# Patient Record
Sex: Male | Born: 1950 | Race: Black or African American | Hispanic: No | Marital: Married | Smoking: Former smoker
Health system: Southern US, Community
[De-identification: ages and names within clinical notes are randomized; demographics above are authoritative.]

## PROBLEM LIST (undated history)

## (undated) DIAGNOSIS — M1990 Unspecified osteoarthritis, unspecified site: Secondary | ICD-10-CM

## (undated) DIAGNOSIS — E119 Type 2 diabetes mellitus without complications: Secondary | ICD-10-CM

## (undated) DIAGNOSIS — I10 Essential (primary) hypertension: Secondary | ICD-10-CM

## (undated) DIAGNOSIS — E785 Hyperlipidemia, unspecified: Secondary | ICD-10-CM

## (undated) HISTORY — PX: VASECTOMY: SHX75

## (undated) HISTORY — DX: Type 2 diabetes mellitus without complications: E11.9

## (undated) HISTORY — PX: HERNIA REPAIR: SHX51

## (undated) HISTORY — DX: Hyperlipidemia, unspecified: E78.5

## (undated) HISTORY — PX: OTHER SURGICAL HISTORY: SHX169

## (undated) HISTORY — DX: Unspecified osteoarthritis, unspecified site: M19.90

## (undated) HISTORY — DX: Essential (primary) hypertension: I10

---

## 1999-07-08 ENCOUNTER — Ambulatory Visit: Admission: RE | Admit: 1999-07-08 | Discharge: 1999-07-08 | Payer: Self-pay | Admitting: Family Medicine

## 1999-10-10 ENCOUNTER — Ambulatory Visit: Admission: RE | Admit: 1999-10-10 | Discharge: 1999-10-10 | Payer: Self-pay | Admitting: Family Medicine

## 2000-09-11 ENCOUNTER — Encounter (INDEPENDENT_AMBULATORY_CARE_PROVIDER_SITE_OTHER): Payer: Self-pay | Admitting: *Deleted

## 2000-09-11 ENCOUNTER — Ambulatory Visit (HOSPITAL_BASED_OUTPATIENT_CLINIC_OR_DEPARTMENT_OTHER): Admission: RE | Admit: 2000-09-11 | Discharge: 2000-09-12 | Payer: Self-pay | Admitting: Otolaryngology

## 2003-11-26 ENCOUNTER — Emergency Department (HOSPITAL_COMMUNITY): Admission: EM | Admit: 2003-11-26 | Discharge: 2003-11-26 | Payer: Self-pay | Admitting: Emergency Medicine

## 2008-05-23 ENCOUNTER — Emergency Department (HOSPITAL_BASED_OUTPATIENT_CLINIC_OR_DEPARTMENT_OTHER): Admission: EM | Admit: 2008-05-23 | Discharge: 2008-05-23 | Payer: Self-pay | Admitting: Emergency Medicine

## 2011-01-14 NOTE — Op Note (Signed)
[place]  Patient:    Christian Jordan, Christian Jordan                         MRN: [id number] Proc. Date: 09/11/00 Attending:  Jeannett Senior. Pollyann Kennedy, M.D. CC:         Carola J. Gerri Spore, M.D.   Operative Report  PREOPERATIVE DIAGNOSES: 1. Septal deviation. 2. Turbinate hypertrophy. 3. Tonsillar hypertrophy. 4. Obstructive sleep apnea.  POSTOPERATIVE DIAGNOSES: 1. Septal deviation. 2. Turbinate hypertrophy. 3. Tonsillar hypertrophy. 4. Obstructive sleep apnea. 5. Nasal polyposis.  PROCEDURES: 1. Extensive right nasal polypectomy. 2. Nasal septoplasty. 3. Submucous resection inferior turbinates bilateral. 4. Tonsillectomy. 5. Uvulopalatopharyngoplasty.  SURGEON:  Jefry H. Pollyann Kennedy, M.D.  ANESTHESIA:  General endotracheal anesthesia.  COMPLICATIONS:  None.  ESTIMATED BLOOD LOSS:  40 cc.  Patient tolerated the procedure well, was awakened, extubated and transferred to recovery in stable condition.  REFERRING PHYSICIAN:  Carola J. Gerri Spore, M.D.  FINDINGS: 1. Extensive nasal polyposis on the right side with mild nasal polyposis on    the left side.  Right side, the entire posterior nasal cavity was filled    with large polypoid mass.  The polyps seemed to be arising from the    superior meatus area just behind the vertical attachment of the middle    turbinate. 2. Nasal septal deviation with a large bony spur posteriorly down the left    side causing obstruction of the left nasal cavity. 3. Significant bony enlargement of the inferior turbinates. 4. Moderate enlargement of the tonsils with cryptic spaces and debris. 5. Severely thickened and elongated uvula, soft palate and hypertrophic    muscular posterior fossal arches.  HISTORY:  This is a 60 year old with a history of bad snoring and obstructive sleep apnea.  He was not able to tolerate CPAP.  He has chronic, severe nasal obstruction.  Risks, benefits, alternatives and complications of the  procedure were explained to the patient, who seemed to understand and agreed to surgery.  DESCRIPTION OF PROCEDURE:  The patient was taken to the operating room, placed on the operating table in supine position.  Following induction of general endotracheal anesthesia, the patient was prepped and draped in a standard fashion.  1. Nasal polypectomy.  Nasal cavity was injected with 1% Xylocaine with    1:100,000 epinephrine along the septum, the columella, the inferior    turbinates and the base of the attachment of the polypoid mass on the right    side and left side.  Extensive nasal polypectomy was performed using a    headlight and polypectomy forceps.  Polyp material was sent for pathologic    evaluation.  There was extensive polyposis on the right side with a large    polyp filling the posterior choanae and mild polyposis posteriorly down the    left side with mild obstruction on the left side and secondary to the    polyps.  2. Nasal septoplasty.  A left hemitransfixion incision was used to approach    the septal cartilage and perimucal and perichondrial flap was developed    posteriorly down the left side.  Mucosa was carefully elevated off the    large spur down the left posterior nasal cavity.  The bony cartilaginous    junction was divided and a flap was developed posteriorly down the right    side.  The upper ethmoid plate was severely thinned and was taken down    simply superiorly and posteriorly  with a Therapist, nutritional.  The inferior    attachment was thicker and this was resected using Jansen-Middleton    rongeur.  The large bony spur was resected, which adequately opened up the    posterior nasal cavity on the left side.  The mucosal incision was    reapproximated with 4-0 chromic suture.  The septal flaps were quilted with    4-0 plain gut.  3. Submucous resection inferior turbinates.  The leading edge of the inferior    turbinates was incised vertically with a 15  scalpel.  Freer elevator was    used to elevate the mucosa off the medial, inferior and lateral aspects of    the turbinate bone.  Large fragments of turbinate bone were then resected    with a Takahashi forceps.  The turbinate remnants were out-fractured.  The    mucosa was preserved.  The nasal cavities were packed with rolled up Telfa    gauze coated with bacitracin ointment.  4. Tonsillectomy.  Tonsils were dissected cleanly with their capsule intact    out of their fossa using electrocautery dissection.  There were large    cryptic spaces with debris present on both sides and on the right, inferior    aspect was a small abscess within the substance of the tonsil.  The    tonsillectomy was performed in conjunction with the    uvulopalatopharyngoplasty.  5. Uvulopalatopharyngoplasty.  The upper corner mucosal incisions were marked    with electrocautery in the soft palate.  Palpation assured that there was    sufficient remaining palate to reach the posterior pharyngeal wall and to    prevent postoperative velopharyngeal insufficiency.  The anterior mucosa    was incised with electrocautery and then the deeper tissues were incised as    well.  The muscularis uvulae was incised.  Suction cautery was used in    several small areas where there were vessels present.  The posterior mucosa    was incised leaving slightly excess length, which would be brought forward    later in closure.  The dissection then continued down laterally taking the    severely thickened, hypertrophic posterior fossal arch musculature and some    mucosa as well.  This was kept in continuity with the tonsillectomy    specimen and all were sent together for pathologic evaluation.  Spot    cautery was used as needed for hemostasis.  The pharynx was irrigated with saline and suctioned.  The mucosal closure was then accomplished using interrupted, inverted 4-0 Vicryl suture.  There was nice widening of the pharyngeal  airway.  There was no bleeding or swelling  present at the termination.  The gastric contents were aspirated with an oral gastric tube.  Patient was then awakened from anesthesia, extubated and transferred to recovery room in stable condition. DD:  09/11/00 TD:  09/11/00 Job: [id number]

## 2013-02-25 ENCOUNTER — Ambulatory Visit (INDEPENDENT_AMBULATORY_CARE_PROVIDER_SITE_OTHER): Payer: Federal, State, Local not specified - PPO | Admitting: Family Medicine

## 2013-02-25 ENCOUNTER — Ambulatory Visit: Payer: Federal, State, Local not specified - PPO

## 2013-02-25 VITALS — BP 126/70 | HR 69 | Temp 98.0°F | Resp 18 | Ht 74.5 in | Wt 237.0 lb

## 2013-02-25 DIAGNOSIS — M545 Low back pain, unspecified: Secondary | ICD-10-CM

## 2013-02-25 DIAGNOSIS — M549 Dorsalgia, unspecified: Secondary | ICD-10-CM

## 2013-02-25 DIAGNOSIS — M25511 Pain in right shoulder: Secondary | ICD-10-CM

## 2013-02-25 DIAGNOSIS — M25519 Pain in unspecified shoulder: Secondary | ICD-10-CM

## 2013-02-25 LAB — POCT UA - MICROSCOPIC ONLY
Bacteria, U Microscopic: NEGATIVE
Casts, Ur, LPF, POC: NEGATIVE
Crystals, Ur, HPF, POC: NEGATIVE
Mucus, UA: NEGATIVE
RBC, urine, microscopic: NEGATIVE
WBC, Ur, HPF, POC: NEGATIVE
Yeast, UA: NEGATIVE

## 2013-02-25 LAB — POCT URINALYSIS DIPSTICK
Bilirubin, UA: NEGATIVE
Blood, UA: NEGATIVE
Glucose, UA: 250
Ketones, UA: NEGATIVE
Leukocytes, UA: NEGATIVE
Nitrite, UA: NEGATIVE
Protein, UA: NEGATIVE
Spec Grav, UA: 1.025
Urobilinogen, UA: 2
pH, UA: 7

## 2013-02-25 MED ORDER — PREDNISONE 20 MG PO TABS: ORAL_TABLET | ORAL | Status: DC
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
## 2013-11-27 ENCOUNTER — Ambulatory Visit (INDEPENDENT_AMBULATORY_CARE_PROVIDER_SITE_OTHER): Payer: Federal, State, Local not specified - PPO | Admitting: Family Medicine

## 2013-11-27 VITALS — BP 122/74 | HR 69 | Temp 97.7°F | Resp 18 | Ht 74.5 in | Wt 236.0 lb

## 2013-11-27 DIAGNOSIS — S335XXA Sprain of ligaments of lumbar spine, initial encounter: Secondary | ICD-10-CM

## 2013-11-27 DIAGNOSIS — M545 Low back pain, unspecified: Secondary | ICD-10-CM

## 2013-11-27 NOTE — Patient Instructions (Signed)

## 2013-11-27 NOTE — Progress Notes (Signed)
Subjective:    Patient ID: Christian Jordan, male    DOB: 09-14-1950, 63 y.o.   MRN: [id number]  11/27/2013  Back Pain   Back Pain   This 63 y.o. male presents for evaluation of back pain.    History of chronic back pain.  Onset of lower back pain onset five days ago.  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  Lots of heavy lifting and bending.  About to retire.  Minimal pain now; slightly stiff.  Has FMLA for DDD lumbar spine; job requires excuse.  No radiation into legs; no n/t/w.   Normal b/b function.  No saddle paresthesias.  No medications.  Milder episode this time.  More stiffness than anything else.  Last lumbar films 01/2013 when evaluated by Dr. Milus GlazierLauenstein.  PCP is [facility].  FMLA paperwork is through the [facility].  [facility].  Review of Systems  Musculoskeletal: Positive for back pain.    Past Medical History  Diagnosis Date  . Arthritis   . Diabetes mellitus without complication   . Hyperlipidemia   . Hypertension    No Known Allergies Current Outpatient Prescriptions  Medication Sig Dispense Refill  . acarbose (PRECOSE) 100 MG tablet Take 100 mg by mouth 2 (two) times daily.      Marland Kitchen. amLODipine (NORVASC) 2.5 MG tablet Take 2.5 mg by mouth daily.      Marland Kitchen. atorvastatin (LIPITOR) 40 MG tablet Take 40 mg by mouth daily.      Marland Kitchen. glipiZIDE (GLUCOTROL) 10 MG tablet Take 10 mg by mouth 2 (two) times daily before a meal.      . ibuprofen (ADVIL,MOTRIN) 200 MG tablet Take 200 mg by mouth every 6 (six) hours as needed for pain.      Marland Kitchen. lisinopril (PRINIVIL,ZESTRIL) 40 MG tablet Take 40 mg by mouth daily.      . metFORMIN (GLUCOPHAGE) 1000 MG tablet Take 1,000 mg by mouth 2 (two) times daily with a meal.      . predniSONE (DELTASONE) 20 MG tablet 2 daily with food  10 tablet  1   No current facility-administered medications for this visit.   History   Social History  . Marital Status: Married    Spouse Name: N/A    Number of Children: N/A  . Years of Education: N/A    Occupational History  .      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   Social History Main Topics  . Smoking status: Never Smoker   . Smokeless tobacco: Not on file  . Alcohol Use: No  . Drug Use: No  . Sexual Activity: Not on file   Other Topics Concern  . Not on file   Social History Narrative  . No narrative on file       Objective:    BP 122/74  Pulse 69  Temp(Src) 97.7 F (36.5 C) (Oral)  Resp 18  Ht 6' 2.5" (1.892 m)  Wt 236 lb (107.049 kg)  BMI 29.90 kg/m2  SpO2 96% Physical Exam  Constitutional: He appears well-developed and well-nourished. No distress.  Cardiovascular: Normal rate, regular rhythm and normal heart sounds.   Pulmonary/Chest: Effort normal and breath sounds normal. He has no wheezes. He has no rales.  Musculoskeletal:       Lumbar back: Normal. He exhibits normal range of motion, no tenderness, no bony tenderness, no pain and no spasm.  Lumbar spine:  Full ROM; straight leg raises negative; toe and heel walking intact;  marching intact; motor 5/5 BLE.  Neurological: He has normal strength. No sensory deficit. He exhibits normal muscle tone.  Skin: He is not diaphoretic.   Results for orders placed in visit on 02/25/13  POCT UA - MICROSCOPIC ONLY      Result Value Ref Range   WBC, Ur, HPF, POC neg     RBC, urine, microscopic neg     Bacteria, U Microscopic neg     Mucus, UA neg     Epithelial cells, urine per micros 0-1     Crystals, Ur, HPF, POC neg     Casts, Ur, LPF, POC neg     Yeast, UA neg    POCT URINALYSIS DIPSTICK      Result Value Ref Range   Color, UA yellow     Clarity, UA clear     Glucose, UA 250     Bilirubin, UA neg     Ketones, UA neg     Spec Grav, UA 1.025     Blood, UA neg     pH, UA 7.0     Protein, UA neg     Urobilinogen, UA 2.0     Nitrite, UA neg     Leukocytes, UA Negative         Assessment & Plan:  Low back pain  Lumbar sprain  1. Low back pain/lumbar sprain: recurrent and now resolved; oow note x 3 days;  return to work tomorrow; recommend regular exercise, weight loss, home exercise program to perform daily. No orders of the defined types were placed in this encounter.    No Follow-up on file.   Nilda Simmer, M.D.  [facility] [address] [place], [place]  [address] [phone] phone [phone] fax

## 2015-11-26 ENCOUNTER — Emergency Department (HOSPITAL_COMMUNITY)
Admission: EM | Admit: 2015-11-26 | Discharge: 2015-11-26 | Disposition: A | Discharge status: Home / Self Care | Payer: No Typology Code available for payment source | Attending: Emergency Medicine | Admitting: Emergency Medicine

## 2015-11-26 ENCOUNTER — Encounter (HOSPITAL_COMMUNITY): Payer: Self-pay | Admitting: Emergency Medicine

## 2015-11-26 DIAGNOSIS — Z79899 Other long term (current) drug therapy: Secondary | ICD-10-CM | POA: Diagnosis not present

## 2015-11-26 DIAGNOSIS — E119 Type 2 diabetes mellitus without complications: Secondary | ICD-10-CM | POA: Insufficient documentation

## 2015-11-26 DIAGNOSIS — Y998 Other external cause status: Secondary | ICD-10-CM | POA: Insufficient documentation

## 2015-11-26 DIAGNOSIS — Z7984 Long term (current) use of oral hypoglycemic drugs: Secondary | ICD-10-CM | POA: Insufficient documentation

## 2015-11-26 DIAGNOSIS — Y9389 Activity, other specified: Secondary | ICD-10-CM | POA: Diagnosis not present

## 2015-11-26 DIAGNOSIS — M1990 Unspecified osteoarthritis, unspecified site: Secondary | ICD-10-CM | POA: Insufficient documentation

## 2015-11-26 DIAGNOSIS — E785 Hyperlipidemia, unspecified: Secondary | ICD-10-CM | POA: Diagnosis not present

## 2015-11-26 DIAGNOSIS — S199XXA Unspecified injury of neck, initial encounter: Secondary | ICD-10-CM | POA: Insufficient documentation

## 2015-11-26 DIAGNOSIS — Z7952 Long term (current) use of systemic steroids: Secondary | ICD-10-CM | POA: Diagnosis not present

## 2015-11-26 DIAGNOSIS — S4992XA Unspecified injury of left shoulder and upper arm, initial encounter: Secondary | ICD-10-CM | POA: Diagnosis not present

## 2015-11-26 DIAGNOSIS — Y92410 Unspecified street and highway as the place of occurrence of the external cause: Secondary | ICD-10-CM | POA: Diagnosis not present

## 2015-11-26 DIAGNOSIS — I10 Essential (primary) hypertension: Secondary | ICD-10-CM | POA: Insufficient documentation

## 2015-11-26 DIAGNOSIS — S4991XA Unspecified injury of right shoulder and upper arm, initial encounter: Secondary | ICD-10-CM | POA: Insufficient documentation

## 2015-11-26 MED ORDER — NAPROXEN SODIUM 220 MG PO TABS: 220.0000 mg | ORAL_TABLET | Freq: Two times a day (BID) | ORAL | Status: DC
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
## 2018-04-19 ENCOUNTER — Other Ambulatory Visit: Payer: Self-pay

## 2018-04-19 ENCOUNTER — Encounter (HOSPITAL_COMMUNITY): Payer: Self-pay | Admitting: *Deleted

## 2018-04-19 ENCOUNTER — Emergency Department (HOSPITAL_COMMUNITY)
Admission: EM | Admit: 2018-04-19 | Discharge: 2018-04-20 | Disposition: A | Discharge status: Home / Self Care | Payer: No Typology Code available for payment source | Attending: Emergency Medicine | Admitting: Emergency Medicine

## 2018-04-19 DIAGNOSIS — I10 Essential (primary) hypertension: Secondary | ICD-10-CM | POA: Insufficient documentation

## 2018-04-19 DIAGNOSIS — R109 Unspecified abdominal pain: Secondary | ICD-10-CM

## 2018-04-19 DIAGNOSIS — Z794 Long term (current) use of insulin: Secondary | ICD-10-CM | POA: Diagnosis not present

## 2018-04-19 DIAGNOSIS — Z7982 Long term (current) use of aspirin: Secondary | ICD-10-CM | POA: Diagnosis not present

## 2018-04-19 DIAGNOSIS — Z79899 Other long term (current) drug therapy: Secondary | ICD-10-CM | POA: Diagnosis not present

## 2018-04-19 DIAGNOSIS — K8010 Calculus of gallbladder with chronic cholecystitis without obstruction: Secondary | ICD-10-CM | POA: Diagnosis not present

## 2018-04-19 DIAGNOSIS — K8020 Calculus of gallbladder without cholecystitis without obstruction: Secondary | ICD-10-CM | POA: Diagnosis not present

## 2018-04-19 DIAGNOSIS — E119 Type 2 diabetes mellitus without complications: Secondary | ICD-10-CM | POA: Insufficient documentation

## 2018-04-19 LAB — COMPREHENSIVE METABOLIC PANEL
ALBUMIN: 3.9 g/dL (ref 3.5–5.0)
ALK PHOS: 67 U/L (ref 38–126)
ALT: 24 U/L (ref 0–44)
AST: 19 U/L (ref 15–41)
Anion gap: 8 (ref 5–15)
BILIRUBIN TOTAL: 0.5 mg/dL (ref 0.3–1.2)
BUN: 13 mg/dL (ref 8–23)
CALCIUM: 9.1 mg/dL (ref 8.9–10.3)
CO2: 30 mmol/L (ref 22–32)
Chloride: 104 mmol/L (ref 98–111)
Creatinine, Ser: 1.42 mg/dL — ABNORMAL HIGH (ref 0.61–1.24)
GFR calc Af Amer: 57 mL/min — ABNORMAL LOW (ref >60)
GFR calc non Af Amer: 49 mL/min — ABNORMAL LOW (ref >60)
GLUCOSE: 89 mg/dL (ref 70–99)
Potassium: 4.5 mmol/L (ref 3.5–5.1)
SODIUM: 142 mmol/L (ref 135–145)
Total Protein: 7.2 g/dL (ref 6.5–8.1)

## 2018-04-19 LAB — CBC
HEMATOCRIT: 39.1 % (ref 39.0–52.0)
Hemoglobin: 13 g/dL (ref 13.0–17.0)
MCH: 30.4 pg (ref 26.0–34.0)
MCHC: 33.2 g/dL (ref 30.0–36.0)
MCV: 91.4 fL (ref 78.0–100.0)
Platelets: 238 10*3/uL (ref 150–400)
RBC: 4.28 MIL/uL (ref 4.22–5.81)
RDW: 12.5 % (ref 11.5–15.5)
WBC: 8.4 10*3/uL (ref 4.0–10.5)

## 2018-04-19 LAB — URINALYSIS, ROUTINE W REFLEX MICROSCOPIC
BACTERIA UA: NONE SEEN
BILIRUBIN URINE: NEGATIVE
Glucose, UA: NEGATIVE mg/dL
Hgb urine dipstick: NEGATIVE
Ketones, ur: NEGATIVE mg/dL
Nitrite: NEGATIVE
PH: 7 (ref 5.0–8.0)
Protein, ur: NEGATIVE mg/dL
SPECIFIC GRAVITY, URINE: 1.021 (ref 1.005–1.030)

## 2018-04-19 LAB — LIPASE, BLOOD: Lipase: 70 U/L — ABNORMAL HIGH (ref 11–51)

## 2018-04-19 MED ORDER — ONDANSETRON 4 MG PO TBDP
4.0000 mg | ORAL_TABLET | Freq: Once | ORAL | Status: AC | PRN
  Administered 2018-04-19: 4 mg via ORAL
  Filled 2018-04-19: qty 1

## 2018-04-19 NOTE — ED Triage Notes (Signed)
Pt c/o R sided abd pain with some pain in his back. Pain had sudden onset around noon today. Had a CT last week because he has had kidney stones in the past but the CT was negative for stones at that time.

## 2018-04-19 NOTE — ED Notes (Signed)
Pt reporting incr'd nausea.

## 2018-04-20 ENCOUNTER — Emergency Department (HOSPITAL_COMMUNITY): Payer: No Typology Code available for payment source

## 2018-04-20 ENCOUNTER — Encounter (HOSPITAL_COMMUNITY): Payer: Self-pay

## 2018-04-20 ENCOUNTER — Other Ambulatory Visit: Payer: Self-pay

## 2018-04-20 ENCOUNTER — Inpatient Hospital Stay (HOSPITAL_COMMUNITY)
Admission: EM | Admit: 2018-04-20 | Discharge: 2018-04-22 | DRG: 419 | Disposition: A | Discharge status: Home / Self Care | Payer: Medicare Other | Attending: General Surgery | Admitting: General Surgery

## 2018-04-20 DIAGNOSIS — K819 Cholecystitis, unspecified: Secondary | ICD-10-CM | POA: Diagnosis present

## 2018-04-20 DIAGNOSIS — I10 Essential (primary) hypertension: Secondary | ICD-10-CM | POA: Diagnosis present

## 2018-04-20 DIAGNOSIS — Z7982 Long term (current) use of aspirin: Secondary | ICD-10-CM

## 2018-04-20 DIAGNOSIS — Z791 Long term (current) use of non-steroidal anti-inflammatories (NSAID): Secondary | ICD-10-CM

## 2018-04-20 DIAGNOSIS — K429 Umbilical hernia without obstruction or gangrene: Secondary | ICD-10-CM | POA: Diagnosis present

## 2018-04-20 DIAGNOSIS — R7989 Other specified abnormal findings of blood chemistry: Secondary | ICD-10-CM | POA: Diagnosis present

## 2018-04-20 DIAGNOSIS — M1990 Unspecified osteoarthritis, unspecified site: Secondary | ICD-10-CM | POA: Diagnosis present

## 2018-04-20 DIAGNOSIS — E785 Hyperlipidemia, unspecified: Secondary | ICD-10-CM | POA: Diagnosis present

## 2018-04-20 DIAGNOSIS — Z794 Long term (current) use of insulin: Secondary | ICD-10-CM

## 2018-04-20 DIAGNOSIS — E1140 Type 2 diabetes mellitus with diabetic neuropathy, unspecified: Secondary | ICD-10-CM | POA: Diagnosis present

## 2018-04-20 DIAGNOSIS — N289 Disorder of kidney and ureter, unspecified: Secondary | ICD-10-CM | POA: Diagnosis present

## 2018-04-20 DIAGNOSIS — K8020 Calculus of gallbladder without cholecystitis without obstruction: Secondary | ICD-10-CM

## 2018-04-20 DIAGNOSIS — E669 Obesity, unspecified: Secondary | ICD-10-CM | POA: Diagnosis present

## 2018-04-20 DIAGNOSIS — K8010 Calculus of gallbladder with chronic cholecystitis without obstruction: Principal | ICD-10-CM | POA: Diagnosis present

## 2018-04-20 LAB — COMPREHENSIVE METABOLIC PANEL
ALK PHOS: 78 U/L (ref 38–126)
ALT: 20 U/L (ref 0–44)
ANION GAP: 12 (ref 5–15)
AST: 16 U/L (ref 15–41)
Albumin: 4 g/dL (ref 3.5–5.0)
BUN: 13 mg/dL (ref 8–23)
CALCIUM: 9 mg/dL (ref 8.9–10.3)
CO2: 25 mmol/L (ref 22–32)
CREATININE: 1.66 mg/dL — AB (ref 0.61–1.24)
Chloride: 98 mmol/L (ref 98–111)
GFR, EST AFRICAN AMERICAN: 47 mL/min — AB (ref >60)
GFR, EST NON AFRICAN AMERICAN: 41 mL/min — AB (ref >60)
Glucose, Bld: 141 mg/dL — ABNORMAL HIGH (ref 70–99)
Potassium: 4.2 mmol/L (ref 3.5–5.1)
Sodium: 135 mmol/L (ref 135–145)
TOTAL PROTEIN: 7.2 g/dL (ref 6.5–8.1)
Total Bilirubin: 0.8 mg/dL (ref 0.3–1.2)

## 2018-04-20 LAB — CBC
HCT: 39.5 % (ref 39.0–52.0)
HEMOGLOBIN: 13.2 g/dL (ref 13.0–17.0)
MCH: 30.6 pg (ref 26.0–34.0)
MCHC: 33.4 g/dL (ref 30.0–36.0)
MCV: 91.6 fL (ref 78.0–100.0)
PLATELETS: 236 10*3/uL (ref 150–400)
RBC: 4.31 MIL/uL (ref 4.22–5.81)
RDW: 12.4 % (ref 11.5–15.5)
WBC: 9.4 10*3/uL (ref 4.0–10.5)

## 2018-04-20 LAB — LIPASE, BLOOD: LIPASE: 24 U/L (ref 11–51)

## 2018-04-20 MED ORDER — HYDROMORPHONE HCL 1 MG/ML IJ SOLN
1.0000 mg | Freq: Once | INTRAMUSCULAR | Status: AC
  Administered 2018-04-20: 1 mg via INTRAVENOUS
  Filled 2018-04-20: qty 1

## 2018-04-20 MED ORDER — ONDANSETRON 8 MG PO TBDP: 8.0000 mg | ORAL_TABLET | Freq: Three times a day (TID) | ORAL | 0 refills | Status: DC | PRN

## 2018-04-20 MED ORDER — SODIUM CHLORIDE 0.9 % IV SOLN
INTRAVENOUS | Status: DC
  Administered 2018-04-20 (×2): via INTRAVENOUS

## 2018-04-20 MED ORDER — ACETAMINOPHEN 325 MG PO TABS: 650.0000 mg | ORAL_TABLET | Freq: Four times a day (QID) | ORAL | 0 refills | Status: DC | PRN

## 2018-04-20 MED ORDER — ONDANSETRON HCL 4 MG/2ML IJ SOLN
4.0000 mg | Freq: Once | INTRAMUSCULAR | Status: AC
  Administered 2018-04-20: 4 mg via INTRAVENOUS
  Filled 2018-04-20: qty 2

## 2018-04-20 MED ORDER — MORPHINE SULFATE (PF) 4 MG/ML IV SOLN
4.0000 mg | Freq: Once | INTRAVENOUS | Status: DC
  Filled 2018-04-20: qty 1

## 2018-04-20 MED ORDER — OMEPRAZOLE 20 MG PO CPDR: 20.0000 mg | DELAYED_RELEASE_CAPSULE | Freq: Every day | ORAL | 0 refills | Status: DC

## 2018-04-20 MED ORDER — ONDANSETRON HCL 4 MG/2ML IJ SOLN
4.0000 mg | Freq: Once | INTRAMUSCULAR | Status: DC
  Filled 2018-04-20: qty 2

## 2018-04-20 NOTE — ED Provider Notes (Signed)
[facility] EMERGENCY DEPARTMENT Provider Note   CSN: [id number] Arrival date & time: 04/19/18  2209     History   Chief Complaint Chief Complaint  Patient presents with  . Abdominal Pain    HPI Christian Jordan is a 67 y.o. male.  HPI  66 year old male comes in with chief complaint of abdominal pain.  Patient has history of diabetes, hyperlipidemia and hypertension.  He reports that around noon he started having sudden onset right upper quadrant abdominal pain.  Pain is radiating to his back.  Patient's pain is described as sharp pain.  He has nausea without vomiting.  Patient denies any UTI-like symptoms.  Patient has history of kidney stones, however reports that he just had a CAT scan last week which did not show any stones.  Patient has had normal bowel movements today.  He denies any fevers, chills.   Patient denies any heavy alcohol use, liver disease, history of kidney infections, heavy smoking or active drug use  Past Medical History:  Diagnosis Date  . Arthritis   . Diabetes mellitus without complication (HCC)   . Hyperlipidemia   . Hypertension     There are no active problems to display for this patient.   Past Surgical History:  Procedure Laterality Date  . HERNIA REPAIR    . Rotator cuff surgery l    . VASECTOMY          Home Medications    Prior to Admission medications   Medication Sig Start Date End Date Taking? Authorizing Provider  acarbose (PRECOSE) 100 MG tablet Take 100 mg by mouth 2 (two) times daily.   Yes [provider]  amLODipine (NORVASC) 2.5 MG tablet Take 2.5 mg by mouth daily.   Yes [provider]  aspirin EC 81 MG tablet Take 81 mg by mouth daily.   Yes [provider]  atorvastatin (LIPITOR) 40 MG tablet Take 40 mg by mouth daily.   Yes [provider]  gabapentin (NEURONTIN) 300 MG capsule Take 300 mg by mouth 2 (two) times daily.   Yes [provider]  insulin aspart  protamine- aspart (NOVOLOG MIX 70/30) (70-30) 100 UNIT/ML injection Inject 30 Units into the skin 2 (two) times daily with a meal.   Yes [provider]  lisinopril (PRINIVIL,ZESTRIL) 40 MG tablet Take 40 mg by mouth daily.   Yes [provider]  metFORMIN (GLUCOPHAGE) 1000 MG tablet Take 1,000 mg by mouth 2 (two) times daily with a meal.   Yes [provider]  naproxen (NAPROSYN) 500 MG tablet Take 500 mg by mouth 2 (two) times daily with a meal.   Yes [provider]  acetaminophen (TYLENOL) 325 MG tablet Take 2 tablets (650 mg total) by mouth every 6 (six) hours as needed. 04/20/18   [name], MD  omeprazole (PRILOSEC) 20 MG capsule Take 1 capsule (20 mg total) by mouth daily. 04/20/18   [name], MD  ondansetron (ZOFRAN ODT) 8 MG disintegrating tablet Take 1 tablet (8 mg total) by mouth every 8 (eight) hours as needed for nausea. 04/20/18   [name], MD    Family History No family history on file.  Social History Social History   Tobacco Use  . Smoking status: Never Smoker  Substance Use Topics  . Alcohol use: No  . Drug use: No     Allergies   Patient has no known allergies.   Review of Systems Review of Systems  Constitutional: Positive  for activity change.  Gastrointestinal: Positive for anal bleeding and nausea.  Genitourinary: Negative for dysuria.  Allergic/Immunologic: Negative for immunocompromised state.  Hematological: Does not bruise/bleed easily.  All other systems reviewed and are negative.    Physical Exam Updated Vital Signs BP (!) 146/73 (BP Location: Right Arm)   Pulse 65   Temp 98 F (36.7 C) (Oral)   Resp 14   SpO2 98%   Physical Exam  Constitutional: He is oriented to person, place, and time. He appears well-developed.  HENT:  Head: Atraumatic.  Neck: Neck supple.  Cardiovascular: Normal rate.  Pulmonary/Chest: Effort normal.  Abdominal: There is tenderness in the right upper quadrant.  There is no guarding, no tenderness at McBurney's point and negative Murphy's sign.  Neurological: He is alert and oriented to person, place, and time.  Skin: Skin is warm.  Nursing note and vitals reviewed.    ED Treatments / Results  Labs (all labs ordered are listed, but only abnormal results are displayed) Labs Reviewed  LIPASE, BLOOD - Abnormal; Notable for the following components:      Result Value   Lipase 70 (*)    All other components within normal limits  COMPREHENSIVE METABOLIC PANEL - Abnormal; Notable for the following components:   Creatinine, Ser 1.42 (*)    GFR calc non Af Amer 49 (*)    GFR calc Af Amer 57 (*)    All other components within normal limits  URINALYSIS, ROUTINE W REFLEX MICROSCOPIC - Abnormal; Notable for the following components:   Leukocytes, UA TRACE (*)    All other components within normal limits  CBC    EKG None  Radiology [place] Abdomen Limited Ruq  Result Date: 04/20/2018 CLINICAL DATA:  Abdominal pain today. EXAM: ULTRASOUND ABDOMEN LIMITED RIGHT UPPER QUADRANT COMPARISON:  None. FINDINGS: Gallbladder: Physiologically distended containing multiple mobile gallstones, largest 7 mm. Small 2 mm polyp versus adherent stone. No pericholecystic fluid or gallbladder wall thickening. No sonographic Murphy sign noted by sonographer. Common bile duct: Diameter: 4 mm. Liver: Subcentimeter cyst in the right hepatic lobe. No suspicious lesion. Within normal limits in parenchymal echogenicity. Portal vein is patent on color Doppler imaging with normal direction of blood flow towards the liver. IMPRESSION: 1. Gallstones and tiny gallbladder polyp. No sonographic findings of cholecystitis. No biliary dilatation. 2. Incidental subcentimeter hepatic cyst. Electronically Signed   By: Rubye OaksMelanie  Ehinger M.D.   On: 04/20/2018 01:19    Procedures Procedures (including critical care time)  Medications Ordered in ED Medications  morphine 4 MG/ML injection 4 mg (4 mg  Intravenous Not Given 04/20/18 0134)  ondansetron (ZOFRAN) injection 4 mg (4 mg Intravenous Not Given 04/20/18 0135)  ondansetron (ZOFRAN-ODT) disintegrating tablet 4 mg (4 mg Oral Given 04/19/18 2308)     Initial Impression / Assessment and Plan / ED Course  I have reviewed the triage vital signs and the nursing notes.  Pertinent labs & imaging results that were available during my care of the patient were reviewed by me and considered in my medical decision making (see chart for details).  Clinical Course as of Apr 21 447  Fri Apr 20, 2018  0227 Results of the ultrasound discussed with the patient.  Patient reports that his pain has improved after morphine, pain currently at 4 or 5 out of 10, and it is quite tolerable.  Patient was given the option of conservative approach, which would require him to follow-up with outpatient doctors for possible HIDA scan or other diagnostic  testing.  He was also given the aggressive option of staying in the hospital as an observation status, and get HIDA scan.  Patient has chosen the former option, stating that he might be able to get into the [place] system quickly. Appropriate return precautions have been discussed with the patient and he is comfortable with the plan of outpatient follow-up with strict ER return precautions.   [AN]    Clinical Course User Index [AN] [name], MD    67 year old male comes in with chief complaint of right upper quadrant abdominal pain that started after food intake.  Patient's pain is radiating to the back.  On exam he has right-sided abdominal tenderness, with negative Murphy sign.  No UTI-like symptoms, therefore we do not think patient has pyelonephritis.  Ultrasound right upper quadrant ordered. Lipase is slightly elevated, patient does not have any clinical findings of pancreatitis.  There could be an element of gastritis as the underlying cause, however as mentioned earlier, the pain is not epigastric in  nature.  Final Clinical Impressions(s) / ED Diagnoses   Final diagnoses:  Abdominal pain  Calculus of gallbladder without cholecystitis without obstruction    ED Discharge Orders         Ordered    ondansetron (ZOFRAN ODT) 8 MG disintegrating tablet  Every 8 hours PRN     04/20/18 0231    acetaminophen (TYLENOL) 325 MG tablet  Every 6 hours PRN     04/20/18 0231    omeprazole (PRILOSEC) 20 MG capsule  Daily     04/20/18 0231           [name], MD 04/20/18 0448

## 2018-04-20 NOTE — ED Notes (Signed)
No signature pad available. Discharge instructions reviewed and all questions answered. Pt verbalized understanding.

## 2018-04-20 NOTE — ED Provider Notes (Signed)
[facility] EMERGENCY DEPARTMENT Provider Note   CSN: [id number] Arrival date & time: 04/20/18  1610     History   Chief Complaint No chief complaint on file.   HPI Christian Jordan is a 67 y.o. male.  Patient with onset of right upper quadrant abdominal pain yesterday afternoon at about 1 PM.  Patient was here evaluation overnight went home around 7 in the morning.  Ultrasound showed multiple gallstones with normal common bile duct.  His labs were normal other than a slightly elevated lipase at 70.  Patient still had pain 4 out of 10 when he was discharged but it was improved.  Patient was given the option for general surgery to see him but he opted to go home and is going to outpatient follow-up with general surgery.  Returns again tonight's pain never really went away got worse again this evening.  Did have an episode of nausea and vomiting.       Past Medical History:  Diagnosis Date  . Arthritis   . Diabetes mellitus without complication (HCC)   . Hyperlipidemia   . Hypertension     There are no active problems to display for this patient.   Past Surgical History:  Procedure Laterality Date  . HERNIA REPAIR    . Rotator cuff surgery l    . VASECTOMY          Home Medications    Prior to Admission medications   Medication Sig Start Date End Date Taking? Authorizing Provider  acarbose (PRECOSE) 100 MG tablet Take 100 mg by mouth 2 (two) times daily.    [provider]  acetaminophen (TYLENOL) 325 MG tablet Take 2 tablets (650 mg total) by mouth every 6 (six) hours as needed. 04/20/18   [name], MD  amLODipine (NORVASC) 2.5 MG tablet Take 2.5 mg by mouth daily.    [provider]  aspirin EC 81 MG tablet Take 81 mg by mouth daily.    [provider]  atorvastatin (LIPITOR) 40 MG tablet Take 40 mg by mouth daily.    [provider]  gabapentin (NEURONTIN) 300 MG capsule Take 300 mg by mouth 2 (two) times  daily.    [provider]  insulin aspart protamine- aspart (NOVOLOG MIX 70/30) (70-30) 100 UNIT/ML injection Inject 30 Units into the skin 2 (two) times daily with a meal.    [provider]  lisinopril (PRINIVIL,ZESTRIL) 40 MG tablet Take 40 mg by mouth daily.    [provider]  metFORMIN (GLUCOPHAGE) 1000 MG tablet Take 1,000 mg by mouth 2 (two) times daily with a meal.    [provider]  naproxen (NAPROSYN) 500 MG tablet Take 500 mg by mouth 2 (two) times daily with a meal.    [provider]  omeprazole (PRILOSEC) 20 MG capsule Take 1 capsule (20 mg total) by mouth daily. 04/20/18   [name], MD  ondansetron (ZOFRAN ODT) 8 MG disintegrating tablet Take 1 tablet (8 mg total) by mouth every 8 (eight) hours as needed for nausea. 04/20/18   [name], MD    Family History No family history on file.  Social History Social History   Tobacco Use  . Smoking status: Never Smoker  Substance Use Topics  . Alcohol use: No  . Drug use: No     Allergies   Patient has no known allergies.   Review of Systems Review of Systems  Constitutional: Negative for fever.  HENT: Negative  for congestion.   Eyes: Negative for redness.  Respiratory: Negative for shortness of breath.   Cardiovascular: Negative for chest pain.  Gastrointestinal: Positive for abdominal pain, nausea and vomiting.  Genitourinary: Negative for dysuria.  Musculoskeletal: Negative for back pain.  Skin: Negative for rash.  Neurological: Negative for headaches.  Hematological: Does not bruise/bleed easily.  Psychiatric/Behavioral: Negative for confusion.     Physical Exam Updated Vital Signs BP (!) 177/86   Pulse (!) 59   Temp 98.4 F (36.9 C) (Oral)   Resp 20   SpO2 98%   Physical Exam  Constitutional: He is oriented to person, place, and time. He appears well-developed and well-nourished. No distress.  HENT:  Head: Normocephalic and atraumatic.    Mouth/Throat: Oropharynx is clear and moist.  Eyes: Pupils are equal, round, and reactive to light. Conjunctivae and EOM are normal.  Neck: Normal range of motion. Neck supple.  Cardiovascular: Normal rate, regular rhythm and normal heart sounds.  Pulmonary/Chest: Effort normal and breath sounds normal. No respiratory distress.  Abdominal: Soft. Bowel sounds are normal. There is tenderness.  Mild tenderness right upper quadrant but more to the right flank area.  No guarding.  Musculoskeletal: Normal range of motion.  Neurological: He is alert and oriented to person, place, and time. No cranial nerve deficit or sensory deficit. He exhibits normal muscle tone. Coordination normal.  Skin: Skin is warm.  Nursing note and vitals reviewed.    ED Treatments / Results  Labs (all labs ordered are listed, but only abnormal results are displayed) Labs Reviewed  COMPREHENSIVE METABOLIC PANEL  CBC  LIPASE, BLOOD    EKG None  Radiology [place] Abdomen Limited Ruq  Result Date: 04/20/2018 CLINICAL DATA:  Abdominal pain today. EXAM: ULTRASOUND ABDOMEN LIMITED RIGHT UPPER QUADRANT COMPARISON:  None. FINDINGS: Gallbladder: Physiologically distended containing multiple mobile gallstones, largest 7 mm. Small 2 mm polyp versus adherent stone. No pericholecystic fluid or gallbladder wall thickening. No sonographic Murphy sign noted by sonographer. Common bile duct: Diameter: 4 mm. Liver: Subcentimeter cyst in the right hepatic lobe. No suspicious lesion. Within normal limits in parenchymal echogenicity. Portal vein is patent on color Doppler imaging with normal direction of blood flow towards the liver. IMPRESSION: 1. Gallstones and tiny gallbladder polyp. No sonographic findings of cholecystitis. No biliary dilatation. 2. Incidental subcentimeter hepatic cyst. Electronically Signed   By: Rubye OaksMelanie  Ehinger M.D.   On: 04/20/2018 01:19    Procedures Procedures (including critical care time)  Medications  Ordered in ED Medications  0.9 %  sodium chloride infusion (has no administration in time range)  ondansetron (ZOFRAN) injection 4 mg (has no administration in time range)  HYDROmorphone (DILAUDID) injection 1 mg (has no administration in time range)     Initial Impression / Assessment and Plan / ED Course  I have reviewed the triage vital signs and the nursing notes.  Pertinent labs & imaging results that were available during my care of the patient were reviewed by me and considered in my medical decision making (see chart for details).    Patient symptoms consistent with prolonged biliary colic.  No complicating factors lipase is normal today no leukocytosis bilirubin is not elevated.  Discussed with Dr. Andrey CampanileWilson general surgery he will admit for prolonged biliary colic.  Patient had an ultrasound earlier today that showed multiple gallstones with a normal common bile duct.  Patient's had persistent nonstop pain without relief since approximately 1:00 yesterday afternoon.   Final Clinical Impressions(s) / ED Diagnoses  Final diagnoses:  Calculus of gallbladder without cholecystitis without obstruction    ED Discharge Orders    None       Vanetta Mulders, MD 04/21/18 [id number]

## 2018-04-20 NOTE — Discharge Instructions (Addendum)
We saw in the ER for abdominal pain. As discussed, there is clinical concern for gallstones causing the pain.  Other possibilities are also possible.  It is prudent that he follow-up with general surgery or GI doctors.  We suspect that you need a HIDA scan based on our evaluation here to figure out which team needs to further manage your condition.  Refrain from spicy food, ibuprofen use, fatty foods for now. Return to the ER immediately if you start having severe pain.

## 2018-04-20 NOTE — ED Notes (Signed)
Zofran and morphine wasted with Florentina AddisonKatie, RN.

## 2018-04-20 NOTE — ED Triage Notes (Signed)
Pt states " I was here until 230 this morning and was told I have gallstones, I need emergency surgery" Pt denies contacting GI or [facility] for follow up. Pt in nad at this time.

## 2018-04-21 ENCOUNTER — Other Ambulatory Visit: Payer: Self-pay

## 2018-04-21 ENCOUNTER — Inpatient Hospital Stay (HOSPITAL_COMMUNITY): Payer: Medicare Other | Admitting: Certified Registered"

## 2018-04-21 ENCOUNTER — Encounter (HOSPITAL_COMMUNITY): Admission: EM | Disposition: A | Discharge status: Home / Self Care | Payer: Self-pay | Source: Home / Self Care

## 2018-04-21 DIAGNOSIS — K819 Cholecystitis, unspecified: Secondary | ICD-10-CM | POA: Diagnosis present

## 2018-04-21 DIAGNOSIS — M1990 Unspecified osteoarthritis, unspecified site: Secondary | ICD-10-CM | POA: Diagnosis present

## 2018-04-21 DIAGNOSIS — E1140 Type 2 diabetes mellitus with diabetic neuropathy, unspecified: Secondary | ICD-10-CM | POA: Diagnosis present

## 2018-04-21 DIAGNOSIS — I10 Essential (primary) hypertension: Secondary | ICD-10-CM | POA: Diagnosis present

## 2018-04-21 DIAGNOSIS — K8020 Calculus of gallbladder without cholecystitis without obstruction: Secondary | ICD-10-CM | POA: Diagnosis present

## 2018-04-21 DIAGNOSIS — R7989 Other specified abnormal findings of blood chemistry: Secondary | ICD-10-CM | POA: Diagnosis present

## 2018-04-21 DIAGNOSIS — Z794 Long term (current) use of insulin: Secondary | ICD-10-CM | POA: Diagnosis not present

## 2018-04-21 DIAGNOSIS — E785 Hyperlipidemia, unspecified: Secondary | ICD-10-CM | POA: Diagnosis present

## 2018-04-21 DIAGNOSIS — Z7982 Long term (current) use of aspirin: Secondary | ICD-10-CM | POA: Diagnosis not present

## 2018-04-21 DIAGNOSIS — N289 Disorder of kidney and ureter, unspecified: Secondary | ICD-10-CM | POA: Diagnosis present

## 2018-04-21 DIAGNOSIS — E669 Obesity, unspecified: Secondary | ICD-10-CM | POA: Diagnosis present

## 2018-04-21 DIAGNOSIS — K429 Umbilical hernia without obstruction or gangrene: Secondary | ICD-10-CM | POA: Diagnosis present

## 2018-04-21 DIAGNOSIS — Z791 Long term (current) use of non-steroidal anti-inflammatories (NSAID): Secondary | ICD-10-CM | POA: Diagnosis not present

## 2018-04-21 DIAGNOSIS — K8010 Calculus of gallbladder with chronic cholecystitis without obstruction: Secondary | ICD-10-CM | POA: Diagnosis present

## 2018-04-21 HISTORY — PX: CHOLECYSTECTOMY: SHX55

## 2018-04-21 LAB — BASIC METABOLIC PANEL
Anion gap: 7 (ref 5–15)
BUN: 13 mg/dL (ref 8–23)
CO2: 30 mmol/L (ref 22–32)
Calcium: 8.7 mg/dL — ABNORMAL LOW (ref 8.9–10.3)
Chloride: 100 mmol/L (ref 98–111)
Creatinine, Ser: 1.76 mg/dL — ABNORMAL HIGH (ref 0.61–1.24)
GFR calc Af Amer: 44 mL/min — ABNORMAL LOW (ref >60)
GFR calc non Af Amer: 38 mL/min — ABNORMAL LOW (ref >60)
GLUCOSE: 155 mg/dL — AB (ref 70–99)
POTASSIUM: 4.1 mmol/L (ref 3.5–5.1)
Sodium: 137 mmol/L (ref 135–145)

## 2018-04-21 LAB — SURGICAL PCR SCREEN
MRSA, PCR: NEGATIVE
STAPHYLOCOCCUS AUREUS: NEGATIVE

## 2018-04-21 LAB — GLUCOSE, CAPILLARY
GLUCOSE-CAPILLARY: 104 mg/dL — AB (ref 70–99)
Glucose-Capillary: 145 mg/dL — ABNORMAL HIGH (ref 70–99)
Glucose-Capillary: 116 mg/dL — ABNORMAL HIGH (ref 70–99)
Glucose-Capillary: 147 mg/dL — ABNORMAL HIGH (ref 70–99)
Glucose-Capillary: 136 mg/dL — ABNORMAL HIGH (ref 70–99)
Glucose-Capillary: 142 mg/dL — ABNORMAL HIGH (ref 70–99)
Glucose-Capillary: 123 mg/dL — ABNORMAL HIGH (ref 70–99)

## 2018-04-21 LAB — HIV ANTIBODY (ROUTINE TESTING W REFLEX): HIV Screen 4th Generation wRfx: NONREACTIVE

## 2018-04-21 LAB — HEMOGLOBIN A1C
HEMOGLOBIN A1C: 5.8 % — AB (ref 4.8–5.6)
Mean Plasma Glucose: 119.76 mg/dL

## 2018-04-21 SURGERY — LAPAROSCOPIC CHOLECYSTECTOMY
Anesthesia: General | Site: Abdomen

## 2018-04-21 MED ORDER — BUPIVACAINE-EPINEPHRINE 0.25% -1:200000 IJ SOLN
INTRAMUSCULAR | Status: DC | PRN
  Administered 2018-04-21: 12 mL

## 2018-04-21 MED ORDER — GLYCOPYRROLATE PF 0.2 MG/ML IJ SOSY
PREFILLED_SYRINGE | INTRAMUSCULAR | Status: DC | PRN
  Administered 2018-04-21: .1 mg via INTRAVENOUS

## 2018-04-21 MED ORDER — ACETAMINOPHEN 650 MG RE SUPP: 650.0000 mg | Freq: Four times a day (QID) | RECTAL | Status: DC | PRN

## 2018-04-21 MED ORDER — DEXAMETHASONE SODIUM PHOSPHATE 4 MG/ML IJ SOLN
INTRAMUSCULAR | Status: DC | PRN
  Administered 2018-04-21: 4 mg via INTRAVENOUS

## 2018-04-21 MED ORDER — DIPHENHYDRAMINE HCL 50 MG/ML IJ SOLN: 12.5000 mg | Freq: Four times a day (QID) | INTRAMUSCULAR | Status: DC | PRN

## 2018-04-21 MED ORDER — TRAMADOL HCL 50 MG PO TABS
50.0000 mg | ORAL_TABLET | Freq: Four times a day (QID) | ORAL | Status: DC
  Administered 2018-04-21 – 2018-04-22 (×4): 50 mg via ORAL
  Filled 2018-04-21 (×4): qty 1

## 2018-04-21 MED ORDER — FENTANYL CITRATE (PF) 100 MCG/2ML IJ SOLN: 25.0000 ug | INTRAMUSCULAR | Status: DC | PRN

## 2018-04-21 MED ORDER — ACETAMINOPHEN 325 MG PO TABS: 650.0000 mg | ORAL_TABLET | Freq: Four times a day (QID) | ORAL | Status: DC | PRN

## 2018-04-21 MED ORDER — MORPHINE SULFATE (PF) 2 MG/ML IV SOLN
1.0000 mg | INTRAVENOUS | Status: DC | PRN
  Administered 2018-04-21 (×2): 2 mg via INTRAVENOUS
  Filled 2018-04-21 (×2): qty 1

## 2018-04-21 MED ORDER — LACTATED RINGERS IV SOLN
INTRAVENOUS | Status: DC
  Administered 2018-04-21 (×2): via INTRAVENOUS

## 2018-04-21 MED ORDER — ACETAMINOPHEN 500 MG PO TABS
1000.0000 mg | ORAL_TABLET | Freq: Three times a day (TID) | ORAL | Status: DC
  Administered 2018-04-21 – 2018-04-22 (×3): 1000 mg via ORAL
  Filled 2018-04-21 (×3): qty 2

## 2018-04-21 MED ORDER — SODIUM CHLORIDE 0.9 % IR SOLN
Status: DC | PRN
  Administered 2018-04-21 (×2): 1000 mL

## 2018-04-21 MED ORDER — OXYCODONE HCL 5 MG/5ML PO SOLN: 5.0000 mg | Freq: Once | ORAL | Status: DC | PRN

## 2018-04-21 MED ORDER — AMLODIPINE BESYLATE 2.5 MG PO TABS
2.5000 mg | ORAL_TABLET | Freq: Every day | ORAL | Status: DC
  Administered 2018-04-21 – 2018-04-22 (×2): 2.5 mg via ORAL
  Filled 2018-04-21 (×2): qty 1

## 2018-04-21 MED ORDER — MIDAZOLAM HCL 2 MG/2ML IJ SOLN
INTRAMUSCULAR | Status: AC
  Filled 2018-04-21: qty 2

## 2018-04-21 MED ORDER — SUCCINYLCHOLINE CHLORIDE 200 MG/10ML IV SOSY
PREFILLED_SYRINGE | INTRAVENOUS | Status: DC | PRN
  Administered 2018-04-21: 140 mg via INTRAVENOUS

## 2018-04-21 MED ORDER — INSULIN ASPART 100 UNIT/ML ~~LOC~~ SOLN
0.0000 [IU] | SUBCUTANEOUS | Status: DC
  Administered 2018-04-21 – 2018-04-22 (×5): 3 [IU] via SUBCUTANEOUS

## 2018-04-21 MED ORDER — POTASSIUM CHLORIDE IN NACL 20-0.45 MEQ/L-% IV SOLN
INTRAVENOUS | Status: DC
  Administered 2018-04-21 (×2): via INTRAVENOUS
  Filled 2018-04-21 (×4): qty 1000

## 2018-04-21 MED ORDER — MORPHINE SULFATE (PF) 2 MG/ML IV SOLN: 2.0000 mg | INTRAVENOUS | Status: DC | PRN

## 2018-04-21 MED ORDER — OXYCODONE HCL 5 MG PO TABS: 5.0000 mg | ORAL_TABLET | ORAL | Status: DC | PRN

## 2018-04-21 MED ORDER — HEPARIN SODIUM (PORCINE) 5000 UNIT/ML IJ SOLN
5000.0000 [IU] | Freq: Three times a day (TID) | INTRAMUSCULAR | Status: DC
  Administered 2018-04-21 – 2018-04-22 (×3): 5000 [IU] via SUBCUTANEOUS
  Filled 2018-04-21 (×3): qty 1

## 2018-04-21 MED ORDER — PROPOFOL 10 MG/ML IV BOLUS
INTRAVENOUS | Status: DC | PRN
  Administered 2018-04-21: 200 mg via INTRAVENOUS

## 2018-04-21 MED ORDER — OXYCODONE HCL 5 MG PO TABS: 5.0000 mg | ORAL_TABLET | Freq: Once | ORAL | Status: DC | PRN

## 2018-04-21 MED ORDER — LIDOCAINE 2% (20 MG/ML) 5 ML SYRINGE
INTRAMUSCULAR | Status: DC | PRN
  Administered 2018-04-21: 60 mg via INTRAVENOUS

## 2018-04-21 MED ORDER — MIDAZOLAM HCL 5 MG/5ML IJ SOLN
INTRAMUSCULAR | Status: DC | PRN
  Administered 2018-04-21: 1 mg via INTRAVENOUS

## 2018-04-21 MED ORDER — LISINOPRIL 40 MG PO TABS
40.0000 mg | ORAL_TABLET | Freq: Every day | ORAL | Status: DC
  Administered 2018-04-21 – 2018-04-22 (×2): 40 mg via ORAL
  Filled 2018-04-21 (×2): qty 1

## 2018-04-21 MED ORDER — ONDANSETRON HCL 4 MG/2ML IJ SOLN: 4.0000 mg | Freq: Four times a day (QID) | INTRAMUSCULAR | Status: DC | PRN

## 2018-04-21 MED ORDER — DIPHENHYDRAMINE HCL 12.5 MG/5ML PO ELIX: 12.5000 mg | ORAL_SOLUTION | Freq: Four times a day (QID) | ORAL | Status: DC | PRN

## 2018-04-21 MED ORDER — FENTANYL CITRATE (PF) 250 MCG/5ML IJ SOLN
INTRAMUSCULAR | Status: AC
  Filled 2018-04-21: qty 5

## 2018-04-21 MED ORDER — GABAPENTIN 300 MG PO CAPS
300.0000 mg | ORAL_CAPSULE | Freq: Two times a day (BID) | ORAL | Status: DC
  Administered 2018-04-21 – 2018-04-22 (×3): 300 mg via ORAL
  Filled 2018-04-21 (×3): qty 1

## 2018-04-21 MED ORDER — ONDANSETRON HCL 4 MG/2ML IJ SOLN
INTRAMUSCULAR | Status: DC | PRN
  Administered 2018-04-21: 4 mg via INTRAVENOUS

## 2018-04-21 MED ORDER — BUPIVACAINE-EPINEPHRINE (PF) 0.25% -1:200000 IJ SOLN
INTRAMUSCULAR | Status: AC
  Filled 2018-04-21: qty 30

## 2018-04-21 MED ORDER — IOPAMIDOL (ISOVUE-300) INJECTION 61%
INTRAVENOUS | Status: AC
  Filled 2018-04-21: qty 50

## 2018-04-21 MED ORDER — SODIUM CHLORIDE 0.9 % IV SOLN
2.0000 g | INTRAVENOUS | Status: DC
  Administered 2018-04-21: 2 g via INTRAVENOUS
  Filled 2018-04-21: qty 20

## 2018-04-21 MED ORDER — ESMOLOL HCL 100 MG/10ML IV SOLN
INTRAVENOUS | Status: DC | PRN
  Administered 2018-04-21 (×4): 10 mg via INTRAVENOUS

## 2018-04-21 MED ORDER — SUGAMMADEX SODIUM 200 MG/2ML IV SOLN
INTRAVENOUS | Status: DC | PRN
  Administered 2018-04-21: 222.2 mg via INTRAVENOUS

## 2018-04-21 MED ORDER — 0.9 % SODIUM CHLORIDE (POUR BTL) OPTIME
TOPICAL | Status: DC | PRN
  Administered 2018-04-21: 1000 mL

## 2018-04-21 MED ORDER — HYDROMORPHONE HCL 1 MG/ML IJ SOLN
1.0000 mg | Freq: Once | INTRAMUSCULAR | Status: AC
  Administered 2018-04-21: 1 mg via INTRAVENOUS
  Filled 2018-04-21: qty 1

## 2018-04-21 MED ORDER — FENTANYL CITRATE (PF) 100 MCG/2ML IJ SOLN
INTRAMUSCULAR | Status: DC | PRN
  Administered 2018-04-21: 50 ug via INTRAVENOUS
  Administered 2018-04-21: 25 ug via INTRAVENOUS
  Administered 2018-04-21: 50 ug via INTRAVENOUS

## 2018-04-21 MED ORDER — ROCURONIUM BROMIDE 50 MG/5ML IV SOSY
PREFILLED_SYRINGE | INTRAVENOUS | Status: DC | PRN
  Administered 2018-04-21: 5 mg via INTRAVENOUS
  Administered 2018-04-21: 25 mg via INTRAVENOUS

## 2018-04-21 SURGICAL SUPPLY — 40 items
APPLIER CLIP 5 13 M/L LIGAMAX5 (MISCELLANEOUS) ×4
BLADE CLIPPER SURG (BLADE) ×4 IMPLANT
CANISTER SUCT 3000ML PPV (MISCELLANEOUS) ×4 IMPLANT
CHLORAPREP W/TINT 26ML (MISCELLANEOUS) ×4 IMPLANT
CLIP APPLIE 5 13 M/L LIGAMAX5 (MISCELLANEOUS) ×2 IMPLANT
CLOSURE WOUND 1/2 X4 (GAUZE/BANDAGES/DRESSINGS) ×1
COVER SURGICAL LIGHT HANDLE (MISCELLANEOUS) ×4 IMPLANT
DERMABOND ADVANCED (GAUZE/BANDAGES/DRESSINGS) ×2
DERMABOND ADVANCED .7 DNX12 (GAUZE/BANDAGES/DRESSINGS) ×2 IMPLANT
DRSG TEGADERM 2-3/8X2-3/4 SM (GAUZE/BANDAGES/DRESSINGS) ×16 IMPLANT
ELECT CAUTERY BLADE 6.4 (BLADE) ×4 IMPLANT
ELECT REM PT RETURN 9FT ADLT (ELECTROSURGICAL) ×4
ELECTRODE REM PT RTRN 9FT ADLT (ELECTROSURGICAL) ×2 IMPLANT
GAUZE SPONGE 2X2 8PLY STRL LF (GAUZE/BANDAGES/DRESSINGS) ×2 IMPLANT
GLOVE BIOGEL PI IND STRL 8 (GLOVE) ×2 IMPLANT
GLOVE BIOGEL PI INDICATOR 8 (GLOVE) ×2
GLOVE ECLIPSE 7.5 STRL STRAW (GLOVE) ×4 IMPLANT
GOWN STRL REUS W/ TWL LRG LVL3 (GOWN DISPOSABLE) ×6 IMPLANT
GOWN STRL REUS W/TWL LRG LVL3 (GOWN DISPOSABLE) ×6
KIT BASIN OR (CUSTOM PROCEDURE TRAY) ×4 IMPLANT
KIT TURNOVER KIT B (KITS) ×4 IMPLANT
NS IRRIG 1000ML POUR BTL (IV SOLUTION) ×4 IMPLANT
PAD ARMBOARD 7.5X6 YLW CONV (MISCELLANEOUS) ×8 IMPLANT
PENCIL BUTTON HOLSTER BLD 10FT (ELECTRODE) ×4 IMPLANT
POUCH RETRIEVAL ECOSAC 10 (ENDOMECHANICALS) ×2 IMPLANT
POUCH RETRIEVAL ECOSAC 10MM (ENDOMECHANICALS) ×2
SCISSORS LAP 5X35 DISP (ENDOMECHANICALS) ×4 IMPLANT
SET IRRIG TUBING LAPAROSCOPIC (IRRIGATION / IRRIGATOR) ×4 IMPLANT
SLEEVE ENDOPATH XCEL 5M (ENDOMECHANICALS) ×8 IMPLANT
SPECIMEN JAR SMALL (MISCELLANEOUS) ×4 IMPLANT
SPONGE GAUZE 2X2 STER 10/PKG (GAUZE/BANDAGES/DRESSINGS) ×2
STRIP CLOSURE SKIN 1/2X4 (GAUZE/BANDAGES/DRESSINGS) ×3 IMPLANT
SUT MNCRL AB 4-0 PS2 18 (SUTURE) ×4 IMPLANT
SUT VICRYL 0 UR6 27IN ABS (SUTURE) ×4 IMPLANT
TOWEL OR 17X24 6PK STRL BLUE (TOWEL DISPOSABLE) ×4 IMPLANT
TRAY LAPAROSCOPIC MC (CUSTOM PROCEDURE TRAY) ×4 IMPLANT
TROCAR XCEL BLUNT TIP 100MML (ENDOMECHANICALS) ×4 IMPLANT
TROCAR XCEL NON-BLD 5MMX100MML (ENDOMECHANICALS) ×4 IMPLANT
TUBING INSUFFLATION (TUBING) ×4 IMPLANT
WATER STERILE IRR 1000ML POUR (IV SOLUTION) ×4 IMPLANT

## 2018-04-21 NOTE — Op Note (Signed)
OPERATIVE REPORT  DATE OF OPERATION: 04/21/2018  PATIENT:  Christian Jordan  67 y.o. male  PRE-OPERATIVE DIAGNOSIS:  CHOLECYSTITIS  POST-OPERATIVE DIAGNOSIS:  CHOLECYSTITIS  INDICATION(S) FOR OPERATION:  Symptomatic gallstones by report  FINDINGS:  Mild chronic gallbladder disease  PROCEDURE:  Procedure(s): LAPAROSCOPIC CHOLECYSTECTOMY  SURGEON:  Surgeon(s): Christian Jordan, Jayquon, MD  ASSISTANT: None  ANESTHESIA:   general  COMPLICATIONS:  None  EBL: <20 ml  BLOOD ADMINISTERED: none  DRAINS: none   SPECIMEN:  Source of Specimen:  Gallbladder and contents  COUNTS CORRECT:  YES  PROCEDURE DETAILS: The patient was taken to the operating room and placed on the table in the supine position.  After an adequate endotracheal anesthetic was administered, the patient was prepped with ChloroPrep, and then draped in the usual manner exposing the entire abdomen laterally, inferiorly and up  to the costal margins.  After a proper timeout was performed including identifying the patient and the procedure to be performed, I dissected out an umbilical hernia that was known to present preoperatively, then used to access the peritoneal cavity.  Once this was done, a pursestring suture of 0 Vicryl was passed around the fascial opening.  This was subsequently used to secure the [facility] which was passed into the peritoneal cavity.  Once the [facility] was in place, carbon dioxide gas was insufflated into the peritoneal cavity up to a maximal intra-abdominal pressure of 15mm Hg.The laparoscope, with attached camera and light source, was passed into the peritoneal cavity to visualize the direct insertion of two right upper quadrant 5mm cannulas, and a sup-xiphoid 5mm cannula.  Once all cannulas were in place, the dissection was begun.  Two ratcheted graspers were attached to the dome and infundibulum of the gallbladder and retracted towards the anterior abdominal wall and the right upper quadrant.   Using cautery attached to a dissecting forceps, the peritoneum overlaying the triangle of Chalot and the hepatoduodenal triangle was dissected away exposing the cystic duct and the cystic artery.  A critical window was developed between the CBD and the cystic duct The cystic artery was clipped proximally and distally then transected.  A clip was placed on the gallbladder side of the cystic duct, then the distal cystic duct was clipped three times then transected between the clips.  The gallbladder was then dissected out of the hepatic bed without event.  It was retrieved from the abdomen using an EcoPouch bag without event.  Once the gallbladder was removed, the bed was inspected for hemostasis.  Once excellent hemostasis was obtained all gas and fluids were aspirated from above the liver, then the cannulas were removed.  The supraumbilical incision was closed using the pursestring suture which was in place.  0.25% bupivicaine with epinephrine was injected at all sites.  All 10mm or greater cannula sites were close using a running subcuticular stitch of 4-0 Monocryl.  5.610mm cannula sites were closed with Dermabond only.Steri-Strips and Tagaderm were used to complete the dressings at all sites.  At this point all needle, sponge, and instrument counts were correct.The patient was awakened from anesthesia and taken to the PACU in stable condition.  Marta LamasJames O. Gae BonWyatt, [name], MD, FACS [phone]--pager [phone]--office [facility]  PATIENT DISPOSITION:  PACU - hemodynamically stable.   Christian Jordan 8/24/201910:39 AM

## 2018-04-21 NOTE — Anesthesia Procedure Notes (Signed)
Procedure Name: Intubation Date/Time: 04/21/2018 9:39 AM Performed by: Wilder GladeWinn, Buffey Zabinski G, CRNA Pre-anesthesia Checklist: Patient identified, Suction available, Emergency Drugs available, Patient being monitored and Timeout performed Patient Re-evaluated:Patient Re-evaluated prior to induction Oxygen Delivery Method: Circle system utilized Preoxygenation: Pre-oxygenation with 100% oxygen Induction Type: IV induction Ventilation: Mask ventilation without difficulty Laryngoscope Size: Miller and 2 Grade View: Grade I Tube type: Oral Tube size: 7.0 mm Number of attempts: 1 (brief ) Airway Equipment and Method: Stylet and Video-laryngoscopy Placement Confirmation: ETT inserted through vocal cords under direct vision,  positive ETCO2 and breath sounds checked- equal and bilateral Secured at: 24 cm Tube secured with: Tape Dental Injury: Teeth and Oropharynx as per pre-operative assessment

## 2018-04-21 NOTE — Interval H&P Note (Signed)
History and Physical Interval Note:  Small umbilical hernia, symptomatic cholelithiasis.  For surgery today.  Christian Jordan, [name], Christian Jordan, Christian Jordan [phone]--pager [phone]--office [facility]  04/21/2018 8:05 AM  Christian Jordan  has presented today for surgery, with the diagnosis of CHOLECYSTITIS  The various methods of treatment have been discussed with the patient and family. After consideration of risks, benefits and other options for treatment, the patient has consented to  Procedure(s): LAPAROSCOPIC CHOLECYSTECTOMY WITH POSSIBLE INTRAOPERATIVE CHOLANGIOGRAM (N/A) as a surgical intervention .  The patient's history has been reviewed, patient examined, no change in status, stable for surgery.  I have reviewed the patient's chart and labs.  Questions were answered to the patient's satisfaction.     [name]

## 2018-04-21 NOTE — H&P (Signed)
Christian Jordan is an 67 y.o. male.   Chief Complaint: abdominal pain HPI: 67 yo obese male with DM2 on insulin, htn, hpl, oa came back to ED for persistent ongoing RUQ pain. He came in last night for acute onset of RUQ pain starting around 2pm on Thursday. Some radiation to back but no n/v/f/c/prior episodes. In ED last night, u/s showed gallstones but no cholecystitis. Felt a little better so was discharged. At home, pt had ongoing RUQ discomfort. Didn't think he could wait to get to [facility] so he came back to ED for evaluation.   No prior abd surgery No melena, hematochezia, acholic stools.   Past Medical History:  Diagnosis Date  . Arthritis   . Diabetes mellitus without complication ([place])   . Hyperlipidemia   . Hypertension     Past Surgical History:  Procedure Laterality Date  . HERNIA REPAIR    . Rotator cuff surgery l    . VASECTOMY      No family history on file. Social History:  reports that he has never smoked. He does not have any smokeless tobacco history on file. He reports that he does not drink alcohol or use drugs.  Allergies: No Known Allergies   (Not in a hospital admission)  Results for orders placed or performed during the hospital encounter of 04/20/18 (from the past 48 hour(s))  Comprehensive metabolic panel     Status: Abnormal   Collection Time: 04/20/18  9:13 PM  Result Value Ref Range   Sodium 135 135 - 145 mmol/L   Potassium 4.2 3.5 - 5.1 mmol/L   Chloride 98 98 - 111 mmol/L   CO2 25 22 - 32 mmol/L   Glucose, Bld 141 (H) 70 - 99 mg/dL   BUN 13 8 - 23 mg/dL   Creatinine, Ser 1.66 (H) 0.61 - 1.24 mg/dL   Calcium 9.0 8.9 - 10.3 mg/dL   Total Protein 7.2 6.5 - 8.1 g/dL   Albumin 4.0 3.5 - 5.0 g/dL   AST 16 15 - 41 U/L   ALT 20 0 - 44 U/L   Alkaline Phosphatase 78 38 - 126 U/L   Total Bilirubin 0.8 0.3 - 1.2 mg/dL   GFR calc non Af Amer 41 (L) >60 mL/min   GFR calc Af Amer 47 (L) >60 mL/min    Comment: (NOTE) The eGFR has been calculated  using the CKD EPI equation. This calculation has not been validated in all clinical situations. eGFR's persistently <60 mL/min signify possible Chronic Kidney Disease.    Anion gap 12 5 - 15    Comment: Performed at [facility] [address], [place] [address]  CBC     Status: None   Collection Time: 04/20/18  9:13 PM  Result Value Ref Range   WBC 9.4 4.0 - 10.5 K/uL   RBC 4.31 4.22 - 5.81 MIL/uL   Hemoglobin 13.2 13.0 - 17.0 g/dL   HCT 39.5 39.0 - 52.0 %   MCV 91.6 78.0 - 100.0 fL   MCH 30.6 26.0 - 34.0 pg   MCHC 33.4 30.0 - 36.0 g/dL   RDW 12.4 11.5 - 15.5 %   Platelets 236 150 - 400 K/uL    Comment: Performed at [facility] [address] [place], [place] [address]  Lipase, blood     Status: None   Collection Time: 04/20/18  9:13 PM  Result Value Ref Range   Lipase 24 11 - 51 U/L  Comment: Performed at [facility], [place] [address] [place], [place] [address]   US Abdomen Limited Ruq  Result Date: 04/20/2018 CLINICAL DATA:  Abdominal pain today. EXAM: ULTRASOUND ABDOMEN LIMITED RIGHT UPPER QUADRANT COMPARISON:  None. FINDINGS: Gallbladder: Physiologically distended containing multiple mobile gallstones, largest 7 mm. Small 2 mm polyp versus adherent stone. No pericholecystic fluid or gallbladder wall thickening. No sonographic Murphy sign noted by sonographer. Common bile duct: Diameter: 4 mm. Liver: Subcentimeter cyst in the right hepatic lobe. No suspicious lesion. Within normal limits in parenchymal echogenicity. Portal vein is patent on color Doppler imaging with normal direction of blood flow towards the liver. IMPRESSION: 1. Gallstones and tiny gallbladder polyp. No sonographic findings of cholecystitis. No biliary dilatation. 2. Incidental subcentimeter hepatic cyst. Electronically Signed   By: Jeb Levering M.D.   On: 04/20/2018 01:19    Review of Systems  Constitutional: Positive for chills. Negative for weight loss.  HENT: Negative  for nosebleeds.   Eyes: Negative for blurred vision.  Respiratory: Negative for shortness of breath.   Cardiovascular: Negative for chest pain, palpitations, orthopnea and PND.       Denies DOE  Gastrointestinal: Positive for abdominal pain.  Genitourinary: Negative for dysuria and hematuria.  Musculoskeletal: Negative.   Skin: Negative for itching and rash.  Neurological: Negative for dizziness, focal weakness, seizures, loss of consciousness and headaches.       Denies TIAs, amaurosis fugax  Endo/Heme/Allergies: Does not bruise/bleed easily.  Psychiatric/Behavioral: The patient is not nervous/anxious.     Blood pressure (!) 161/78, pulse 75, temperature 98.4 F (36.9 C), temperature source Oral, resp. rate 18, SpO2 95 %. Physical Exam  Vitals reviewed. Constitutional: He is oriented to person, place, and time. He appears well-developed and well-nourished. No distress.  Obese, central truncal  HENT:  Head: Normocephalic and atraumatic.  Right Ear: External ear normal.  Left Ear: External ear normal.  Eyes: Conjunctivae are normal. No scleral icterus.  Neck: Normal range of motion. Neck supple. No tracheal deviation present. No thyromegaly present.  Cardiovascular: Normal rate and normal heart sounds.  Respiratory: Effort normal and breath sounds normal. No stridor. No respiratory distress. He has no wheezes.  GI: Soft. He exhibits no distension. There is tenderness in the right upper quadrant. There is no rigidity, no rebound and no guarding. A hernia is present. Hernia confirmed positive in the ventral area.    Very mild RUQ TTP; small reducible umbilical hernia  Musculoskeletal: He exhibits no edema or tenderness.  Lymphadenopathy:    He has no cervical adenopathy.  Neurological: He is alert and oriented to person, place, and time. No cranial nerve deficit. He exhibits normal muscle tone. GCS eye subscore is 4. GCS verbal subscore is 5. GCS motor subscore is 6.  Skin: Skin is  warm and dry. No rash noted. He is not diaphoretic. No erythema. No pallor.  Mild brawny skin changes to b/l LE  Psychiatric: He has a normal mood and affect. His behavior is normal. Judgment and thought content normal.     Assessment/Plan Calculous cholecystitis Obesity DM 2 on insulin HTN Small umbilical hernia Elevated creatinine Diabetic neuropathy  Admit Iv abx Failed outpt mgmt of biliary colic Possible early cholecystitis Chemical vte prophylaxis Cont home BP meds Unsure of pt's baseline Cr. If Cr goes up, may need to hold ACEi Plan Lap chole later today with Dr Hulen Skains, pending OR schedule NPO xcept meds  I believe the patient's symptoms are consistent with gallbladder disease.  We discussed gallbladder disease.   I discussed laparoscopic cholecystectomy in detail.  We discussed the risks and benefits of a laparoscopic cholecystectomy including, but not limited to bleeding, infection, injury to surrounding structures such as the intestine or liver, bile leak, retained gallstones, need to convert to an open procedure, prolonged diarrhea, blood clots such as  DVT, common bile duct injury, anesthesia risks, and possible need for additional procedures.  We discussed the typical post-operative recovery course. I explained that the likelihood of improvement of their symptoms is good.  Leighton Ruff. Redmond Pulling, MD, FACS General, Bariatric, & Minimally Invasive Surgery [facility] [place]  Greer Pickerel, MD 04/21/2018, 3:46 AM

## 2018-04-21 NOTE — Plan of Care (Signed)
  Problem: Education: Goal: Knowledge of General Education information will improve Description Including pain rating scale, medication(s)/side effects and non-pharmacologic comfort measures Outcome: Progressing   Problem: Health Behavior/Discharge Planning: Goal: Ability to manage health-related needs will improve Outcome: Progressing   

## 2018-04-21 NOTE — Transfer of Care (Signed)
Immediate Anesthesia Transfer of Care Note  Patient: Christian Jordan  Procedure(s) Performed: LAPAROSCOPIC CHOLECYSTECTOMY (N/A Abdomen)  Patient Location: PACU  Anesthesia Type:General  Level of Consciousness: awake, alert , oriented and patient cooperative  Airway & Oxygen Therapy: Patient Spontanous Breathing and Patient connected to nasal cannula oxygen  Post-op Assessment: Report given to RN and Post -op Vital signs reviewed and stable  Post vital signs: Reviewed and stable  Last Vitals:  Vitals Value Taken Time  BP 160/75 04/21/2018 10:50 AM  Temp 36.2 C 04/21/2018 10:50 AM  Pulse 85 04/21/2018 10:53 AM  Resp 19 04/21/2018 10:53 AM  SpO2 97 % 04/21/2018 10:53 AM  Vitals shown include unvalidated device data.  Last Pain:  Vitals:   04/21/18 0806  TempSrc: Oral  PainSc:       Patients Stated Pain Goal: 0 (04/21/18 0709)  Complications: No apparent anesthesia complications

## 2018-04-21 NOTE — Anesthesia Preprocedure Evaluation (Signed)
Anesthesia Evaluation  Patient identified by MRN, date of birth, ID band Patient awake    Reviewed: Allergy & Precautions, H&P , NPO status , Patient's Chart, lab work & pertinent test results  History of Anesthesia Complications (+) DIFFICULT AIRWAY and history of anesthetic complications  Airway Mallampati: II   Neck ROM: full   Comment: Thick neck. Full dentition Dental   Pulmonary neg pulmonary ROS,    breath sounds clear to auscultation       Cardiovascular hypertension,  Rhythm:regular Rate:Normal     Neuro/Psych    GI/Hepatic   Endo/Other  diabetes, Type 2  Renal/GU Renal InsufficiencyRenal disease     Musculoskeletal  (+) Arthritis ,   Abdominal   Peds  Hematology   Anesthesia Other Findings   Reproductive/Obstetrics                             Anesthesia Physical Anesthesia Plan  ASA: II  Anesthesia Plan: General   Post-op Pain Management:    Induction: Intravenous  PONV Risk Score and Plan: 2 and Ondansetron, Dexamethasone, Midazolam and Treatment may vary due to age or medical condition  Airway Management Planned: Oral ETT and Video Laryngoscope Planned  Additional Equipment:   Intra-op Plan:   Post-operative Plan: Extubation in OR  Informed Consent: I have reviewed the patients History and Physical, chart, labs and discussed the procedure including the risks, benefits and alternatives for the proposed anesthesia with the patient or authorized representative who has indicated his/her understanding and acceptance.     Plan Discussed with: CRNA, Anesthesiologist and Surgeon  Anesthesia Plan Comments:         Anesthesia Quick Evaluation

## 2018-04-22 LAB — GLUCOSE, CAPILLARY
GLUCOSE-CAPILLARY: 131 mg/dL — AB (ref 70–99)
Glucose-Capillary: 103 mg/dL — ABNORMAL HIGH (ref 70–99)
Glucose-Capillary: 115 mg/dL — ABNORMAL HIGH (ref 70–99)

## 2018-04-22 MED ORDER — OXYCODONE HCL 5 MG PO TABS: 5.0000 mg | ORAL_TABLET | Freq: Four times a day (QID) | ORAL | 0 refills | Status: DC | PRN

## 2018-04-22 NOTE — Discharge Instructions (Signed)
Please arrive at least 30 min before your appointment to complete your check in paperwork.  If you are unable to arrive 30 min prior to your appointment time we may have to cancel or reschedule you. ° °LAPAROSCOPIC SURGERY: POST OP INSTRUCTIONS  °1. DIET: Follow a light bland diet the first 24 hours after arrival home, such as soup, liquids, crackers, etc. Be sure to include lots of fluids daily. Avoid fast food or heavy meals as your are more likely to get nauseated. Eat a low fat the next few days after surgery.  °2. Take your usually prescribed home medications unless otherwise directed. °3. PAIN CONTROL:  °1. Pain is best controlled by a usual combination of three different methods TOGETHER:  °1. Ice/Heat °2. Over the counter pain medication °3. Prescription pain medication °2. Most patients will experience some swelling and bruising around the incisions. Ice packs or heating pads (30-60 minutes up to 6 times a day) will help. Use ice for the first few days to help decrease swelling and bruising, then switch to heat to help relax tight/sore spots and speed recovery. Some people prefer to use ice alone, heat alone, alternating between ice & heat. Experiment to what works for you. Swelling and bruising can take several weeks to resolve.  °3. It is helpful to take an over-the-counter pain medication regularly for the first few weeks. Choose one of the following that works best for you:  °1. Naproxen (Aleve, etc) Two 220mg tabs twice a day °2. Ibuprofen (Advil, etc) Three 200mg tabs four times a day (every meal & bedtime) °3. Acetaminophen (Tylenol, etc) 500-650mg four times a day (every meal & bedtime) °4. A prescription for pain medication (such as oxycodone, hydrocodone, etc) should be given to you upon discharge. Take your pain medication as prescribed.  °1. If you are having problems/concerns with the prescription medicine (does not control pain, nausea, vomiting, rash, itching, etc), please call us [phone] to see if we need to switch you to a different pain medicine that will work better for you and/or control your side effect better. °2. If you need a refill on your pain medication, please contact your pharmacy. They will contact our office to request authorization. Prescriptions will not be filled after 5 pm or on week-ends. °4. Avoid getting constipated. Between the surgery and the pain medications, it is common to experience some constipation. Increasing fluid intake and taking a fiber supplement (such as Metamucil, Citrucel, FiberCon, MiraLax, etc) 1-2 times a day regularly will usually help prevent this problem from occurring. A mild laxative (prune juice, Milk of Magnesia, MiraLax, etc) should be taken according to package directions if there are no bowel movements after 48 hours.  °5. Watch out for diarrhea. If you have many loose bowel movements, simplify your diet to bland foods & liquids for a few days. Stop any stool softeners and decrease your fiber supplement. Switching to mild anti-diarrheal medications (Kayopectate, Pepto Bismol) can help. If this worsens or does not improve, please call us. °6. Wash / shower every day. You may shower over the dressings as they are waterproof. Continue to shower over incision(s) after the dressing is off. °7. Remove your waterproof bandages 5 days after surgery. You may leave the incision open to air. You may replace a dressing/Band-Aid to cover the incision for comfort if you wish.  °8. ACTIVITIES as tolerated:  °1. You may resume regular (light) daily activities beginning the next day--such as daily self-care, walking, climbing stairs--gradually   increasing activities as tolerated. If you can walk 30 minutes without difficulty, it is safe to try more intense activity such as jogging, treadmill, bicycling, low-impact aerobics, swimming, etc. °2. Save the most intensive and strenuous activity for last such as sit-ups, heavy lifting, contact sports, etc Refrain  from any heavy lifting or straining until you are off narcotics for pain control.  °3. DO NOT PUSH THROUGH PAIN. Let pain be your guide: If it hurts to do something, don't do it. Pain is your body warning you to avoid that activity for another week until the pain goes down. °4. You may drive when you are no longer taking prescription pain medication, you can comfortably wear a seatbelt, and you can safely maneuver your car and apply brakes. °5. You may have sexual intercourse when it is comfortable.  °9. FOLLOW UP in our office  °1. Please call [facility] at [phone] to set up an appointment to see your surgeon in the office for a follow-up appointment approximately 2-3 weeks after your surgery. °2. Make sure that you call for this appointment the day you arrive home to insure a convenient appointment time. °     10. IF YOU HAVE DISABILITY OR FAMILY LEAVE FORMS, BRING THEM TO THE               OFFICE FOR PROCESSING.  ° °WHEN TO CALL US [phone]:  °1. Poor pain control °2. Reactions / problems with new medications (rash/itching, nausea, etc)  °3. Fever over 101.5 F (38.5 C) °4. Inability to urinate °5. Nausea and/or vomiting °6. Worsening swelling or bruising °7. Continued bleeding from incision. °8. Increased pain, redness, or drainage from the incision ° °The clinic staff is available to answer your questions during regular business hours (8:30am-5pm). Please don’t hesitate to call and ask to speak to one of our nurses for clinical concerns.  °If you have a medical emergency, go to the nearest emergency room or call 911.  °A surgeon from [facility] is always on call at the hospitals  ° °[facility]  °[address], [address], [place], [place] [address] ?  °MAIN: [phone] ? TOLL FREE: [phone] ?  °FAX [phone]  °[email or web address] ° °

## 2018-04-22 NOTE — Progress Notes (Signed)
1 Day Post-Op   Subjective/Chief Complaint: No complaints   Objective: Vital signs in last 24 hours: Temp:  [97.2 F (36.2 C)-98.4 F (36.9 C)] 98.2 F (36.8 C) (08/25 0417) Pulse Rate:  [63-78] 70 (08/25 0417) Resp:  [10-18] 16 (08/25 0417) BP: (144-182)/(64-83) 146/73 (08/25 0417) SpO2:  [91 %-99 %] 96 % (08/25 0417) Last BM Date: 04/19/18  Intake/Output from previous day: 08/24 0701 - 08/25 0700 In: 2587.8 [P.O.:360; I.V.:2077.8] Out: 50 [Blood:50] Intake/Output this shift: No intake/output data recorded.  General appearance: alert and cooperative Resp: clear to auscultation bilaterally Cardio: regular rate and rhythm GI: soft, minimal tenderness  Lab Results:  Recent Labs    04/19/18 2243 04/20/18 2113  WBC 8.4 9.4  HGB 13.0 13.2  HCT 39.1 39.5  PLT 238 236   BMET Recent Labs    04/20/18 2113 04/21/18 0510  NA 135 137  K 4.2 4.1  CL 98 100  CO2 25 30  GLUCOSE 141* 155*  BUN 13 13  CREATININE 1.66* 1.76*  CALCIUM 9.0 8.7*   PT/INR No results for input(s): LABPROT, INR in the last 72 hours. ABG No results for input(s): PHART, HCO3 in the last 72 hours.  Invalid input(s): PCO2, PO2  Studies/Results: No results found.  Anti-infectives: Anti-infectives (From admission, onward)   Start     Dose/Rate Route Frequency Ordered Stop   04/21/18 0500  cefTRIAXone (ROCEPHIN) 2 g in sodium chloride 0.9 % 100 mL IVPB  Status:  Discontinued     2 g 200 mL/hr over 30 Minutes Intravenous Every 24 hours 04/21/18 0448 04/21/18 1137      Assessment/Plan: s/p Procedure(s): LAPAROSCOPIC CHOLECYSTECTOMY (N/A) Advance diet. If he tolerates breakfast then he may be discharged home POD 1  LOS: 1 day    TOTH [name],Mindel Friscia S 04/22/2018

## 2018-04-23 ENCOUNTER — Encounter (HOSPITAL_COMMUNITY): Payer: Self-pay | Admitting: General Surgery

## 2018-04-23 NOTE — Anesthesia Postprocedure Evaluation (Signed)
Anesthesia Post Note  Patient: Henderson CloudJames Neira  Procedure(s) Performed: LAPAROSCOPIC CHOLECYSTECTOMY (N/A Abdomen)     Patient location during evaluation: PACU Anesthesia Type: General Level of consciousness: awake and alert Pain management: pain level controlled Vital Signs Assessment: post-procedure vital signs reviewed and stable Respiratory status: spontaneous breathing, nonlabored ventilation, respiratory function stable and patient connected to nasal cannula oxygen Cardiovascular status: blood pressure returned to baseline and stable Postop Assessment: no apparent nausea or vomiting Anesthetic complications: no    Last Vitals:  Vitals:   04/22/18 0417 04/22/18 1419  BP: (!) 146/73 (!) 135/52  Pulse: 70 66  Resp: 16 17  Temp: 36.8 C 37.1 C  SpO2: 96% 92%    Last Pain:  Vitals:   04/22/18 1419  TempSrc: Oral  PainSc:                  Clova Morlock S

## 2018-05-08 NOTE — Discharge Summary (Signed)
[facility] Discharge Summary   Patient ID: Christian Jordan MRN: [id number] DOB/AGE: 1951-03-10 67 y.o.  Admit date: 04/20/2018 Discharge date: 05/08/2018   Discharge Diagnosis Patient Active Problem List   Diagnosis Date Noted  . Cholecystitis 04/21/2018   Imaging: No results found.  Procedures Dr. Jimmye Norman (04/21/18) - Laparoscopic Cholecystectomy   HPI:  67 yo obese male with DM2 on insulin, htn, hpl, oa came back to ED for persistent ongoing RUQ pain. He came in last night for acute onset of RUQ pain starting around 2pm on Thursday. Some radiation to back but no n/v/f/c/prior episodes. In ED last night, u/s showed gallstones but no cholecystitis. Felt a little better so was discharged. At home, pt had ongoing RUQ discomfort. Didn't think he could wait to get to [facility] so he came back to ED for evaluation.   Hospital Course:  Patient admitted for possible cholecystitis and failed outpatient management of biliary colic. He underwent the surgery above by dr. Lindie Spruce which he tolerated well. He was transferred to the floor. On POD#1 vitals were stable, pain controlled, tolerating PO, mobilizing, and stable for discharge home. He will require outpatient follow up in our office as below.    Allergies as of 04/22/2018   No Known Allergies     Medication List    TAKE these medications   acarbose 100 MG tablet Commonly known as:  PRECOSE Take 100 mg by mouth 2 (two) times daily.   acetaminophen 325 MG tablet Commonly known as:  TYLENOL Take 2 tablets (650 mg total) by mouth every 6 (six) hours as needed. What changed:  reasons to take this   amLODipine 2.5 MG tablet Commonly known as:  NORVASC Take 2.5 mg by mouth daily.   aspirin EC 81 MG tablet Take 81 mg by mouth daily.   atorvastatin 40 MG tablet Commonly known as:  LIPITOR Take 40 mg by mouth daily.   docusate sodium 100 MG capsule Commonly known as:  COLACE Take 100 mg by mouth daily.    gabapentin 300 MG capsule Commonly known as:  NEURONTIN Take 300 mg by mouth 2 (two) times daily.   insulin aspart protamine- aspart (70-30) 100 UNIT/ML injection Commonly known as:  NOVOLOG MIX 70/30 Inject 30 Units into the skin 2 (two) times daily with a meal.   lisinopril 40 MG tablet Commonly known as:  PRINIVIL,ZESTRIL Take 40 mg by mouth daily.   metFORMIN 1000 MG tablet Commonly known as:  GLUCOPHAGE Take 1,000 mg by mouth 2 (two) times daily with a meal.   naproxen 500 MG tablet Commonly known as:  NAPROSYN Take 500 mg by mouth daily with lunch.   omeprazole 20 MG capsule Commonly known as:  PRILOSEC Take 1 capsule (20 mg total) by mouth daily.   ondansetron 8 MG disintegrating tablet Commonly known as:  ZOFRAN-ODT Take 1 tablet (8 mg total) by mouth every 8 (eight) hours as needed for nausea.   oxyCODONE 5 MG immediate release tablet Commonly known as:  Oxy IR/ROXICODONE Take 1 tablet (5 mg total) by mouth every 6 (six) hours as needed for moderate pain.        Follow-up Information    [facility] Follow up.   Specialty:  General Surgery Why:  call to confirm post-operative appointment date/time. Contact information: [address] [place] [address] [phone]          Signed: Hosie Spangle, [facility] 05/08/2018, 11:17  AM

## 2018-05-20 ENCOUNTER — Encounter (HOSPITAL_COMMUNITY): Payer: Self-pay

## 2018-05-20 ENCOUNTER — Other Ambulatory Visit: Payer: Self-pay

## 2018-05-20 ENCOUNTER — Observation Stay (HOSPITAL_COMMUNITY)
Admission: EM | Admit: 2018-05-20 | Discharge: 2018-05-21 | Disposition: A | Discharge status: Home / Self Care | Payer: Non-veteran care | Attending: Internal Medicine | Admitting: Internal Medicine

## 2018-05-20 DIAGNOSIS — E1122 Type 2 diabetes mellitus with diabetic chronic kidney disease: Secondary | ICD-10-CM | POA: Diagnosis not present

## 2018-05-20 DIAGNOSIS — E876 Hypokalemia: Secondary | ICD-10-CM | POA: Diagnosis not present

## 2018-05-20 DIAGNOSIS — D649 Anemia, unspecified: Secondary | ICD-10-CM | POA: Diagnosis not present

## 2018-05-20 DIAGNOSIS — Z9889 Other specified postprocedural states: Secondary | ICD-10-CM | POA: Insufficient documentation

## 2018-05-20 DIAGNOSIS — Z9049 Acquired absence of other specified parts of digestive tract: Secondary | ICD-10-CM | POA: Insufficient documentation

## 2018-05-20 DIAGNOSIS — Z794 Long term (current) use of insulin: Secondary | ICD-10-CM | POA: Insufficient documentation

## 2018-05-20 DIAGNOSIS — N179 Acute kidney failure, unspecified: Secondary | ICD-10-CM | POA: Diagnosis not present

## 2018-05-20 DIAGNOSIS — E11649 Type 2 diabetes mellitus with hypoglycemia without coma: Secondary | ICD-10-CM | POA: Diagnosis not present

## 2018-05-20 DIAGNOSIS — Z7982 Long term (current) use of aspirin: Secondary | ICD-10-CM | POA: Insufficient documentation

## 2018-05-20 DIAGNOSIS — E860 Dehydration: Secondary | ICD-10-CM | POA: Insufficient documentation

## 2018-05-20 DIAGNOSIS — I420 Dilated cardiomyopathy: Secondary | ICD-10-CM | POA: Insufficient documentation

## 2018-05-20 DIAGNOSIS — N183 Chronic kidney disease, stage 3 (moderate): Secondary | ICD-10-CM | POA: Insufficient documentation

## 2018-05-20 DIAGNOSIS — E162 Hypoglycemia, unspecified: Secondary | ICD-10-CM | POA: Diagnosis not present

## 2018-05-20 DIAGNOSIS — R55 Syncope and collapse: Secondary | ICD-10-CM | POA: Diagnosis present

## 2018-05-20 DIAGNOSIS — M1990 Unspecified osteoarthritis, unspecified site: Secondary | ICD-10-CM | POA: Insufficient documentation

## 2018-05-20 DIAGNOSIS — E1142 Type 2 diabetes mellitus with diabetic polyneuropathy: Secondary | ICD-10-CM | POA: Diagnosis not present

## 2018-05-20 DIAGNOSIS — Z79899 Other long term (current) drug therapy: Secondary | ICD-10-CM | POA: Insufficient documentation

## 2018-05-20 DIAGNOSIS — E785 Hyperlipidemia, unspecified: Secondary | ICD-10-CM | POA: Diagnosis not present

## 2018-05-20 DIAGNOSIS — E119 Type 2 diabetes mellitus without complications: Secondary | ICD-10-CM

## 2018-05-20 DIAGNOSIS — IMO0001 Reserved for inherently not codable concepts without codable children: Secondary | ICD-10-CM

## 2018-05-20 DIAGNOSIS — N289 Disorder of kidney and ureter, unspecified: Secondary | ICD-10-CM

## 2018-05-20 DIAGNOSIS — Z9852 Vasectomy status: Secondary | ICD-10-CM | POA: Insufficient documentation

## 2018-05-20 DIAGNOSIS — I129 Hypertensive chronic kidney disease with stage 1 through stage 4 chronic kidney disease, or unspecified chronic kidney disease: Secondary | ICD-10-CM | POA: Insufficient documentation

## 2018-05-20 LAB — URINALYSIS, ROUTINE W REFLEX MICROSCOPIC
BILIRUBIN URINE: NEGATIVE
Glucose, UA: NEGATIVE mg/dL
HGB URINE DIPSTICK: NEGATIVE
Ketones, ur: NEGATIVE mg/dL
Leukocytes, UA: NEGATIVE
Nitrite: NEGATIVE
Protein, ur: NEGATIVE mg/dL
SPECIFIC GRAVITY, URINE: 1.018 (ref 1.005–1.030)
pH: 5 (ref 5.0–8.0)

## 2018-05-20 LAB — CBC WITH DIFFERENTIAL/PLATELET
Abs Immature Granulocytes: 0 10*3/uL (ref 0.0–0.1)
BASOS PCT: 1 %
Basophils Absolute: 0 10*3/uL (ref 0.0–0.1)
EOS ABS: 0.2 10*3/uL (ref 0.0–0.7)
Eosinophils Relative: 3 %
HCT: 36.7 % — ABNORMAL LOW (ref 39.0–52.0)
Hemoglobin: 12 g/dL — ABNORMAL LOW (ref 13.0–17.0)
IMMATURE GRANULOCYTES: 0 %
Lymphocytes Relative: 17 %
Lymphs Abs: 1.3 10*3/uL (ref 0.7–4.0)
MCH: 30.5 pg (ref 26.0–34.0)
MCHC: 32.7 g/dL (ref 30.0–36.0)
MCV: 93.1 fL (ref 78.0–100.0)
MONOS PCT: 7 %
Monocytes Absolute: 0.5 10*3/uL (ref 0.1–1.0)
NEUTROS PCT: 72 %
Neutro Abs: 5.3 10*3/uL (ref 1.7–7.7)
PLATELETS: 239 10*3/uL (ref 150–400)
RBC: 3.94 MIL/uL — AB (ref 4.22–5.81)
RDW: 13 % (ref 11.5–15.5)
WBC: 7.3 10*3/uL (ref 4.0–10.5)

## 2018-05-20 LAB — CBG MONITORING, ED
GLUCOSE-CAPILLARY: 157 mg/dL — AB (ref 70–99)
Glucose-Capillary: 90 mg/dL (ref 70–99)

## 2018-05-20 LAB — COMPREHENSIVE METABOLIC PANEL
ALT: 25 U/L (ref 0–44)
AST: 19 U/L (ref 15–41)
Albumin: 3.7 g/dL (ref 3.5–5.0)
Alkaline Phosphatase: 65 U/L (ref 38–126)
Anion gap: 13 (ref 5–15)
BUN: 30 mg/dL — AB (ref 8–23)
CALCIUM: 9 mg/dL (ref 8.9–10.3)
CHLORIDE: 103 mmol/L (ref 98–111)
CO2: 23 mmol/L (ref 22–32)
CREATININE: 2.02 mg/dL — AB (ref 0.61–1.24)
GFR calc Af Amer: 37 mL/min — ABNORMAL LOW (ref >60)
GFR, EST NON AFRICAN AMERICAN: 32 mL/min — AB (ref >60)
Glucose, Bld: 135 mg/dL — ABNORMAL HIGH (ref 70–99)
POTASSIUM: 3.4 mmol/L — AB (ref 3.5–5.1)
Sodium: 139 mmol/L (ref 135–145)
Total Bilirubin: 0.4 mg/dL (ref 0.3–1.2)
Total Protein: 7.1 g/dL (ref 6.5–8.1)

## 2018-05-20 LAB — GLUCOSE, CAPILLARY
Glucose-Capillary: 61 mg/dL — ABNORMAL LOW (ref 70–99)
Glucose-Capillary: 103 mg/dL — ABNORMAL HIGH (ref 70–99)

## 2018-05-20 MED ORDER — SENNOSIDES-DOCUSATE SODIUM 8.6-50 MG PO TABS: 1.0000 | ORAL_TABLET | Freq: Two times a day (BID) | ORAL | Status: DC

## 2018-05-20 MED ORDER — LISINOPRIL 20 MG PO TABS
20.0000 mg | ORAL_TABLET | Freq: Every day | ORAL | Status: DC
  Administered 2018-05-21: 20 mg via ORAL
  Filled 2018-05-20: qty 1

## 2018-05-20 MED ORDER — SODIUM CHLORIDE 0.9 % IV BOLUS
1000.0000 mL | Freq: Once | INTRAVENOUS | Status: AC
  Administered 2018-05-20: 1000 mL via INTRAVENOUS

## 2018-05-20 MED ORDER — ATORVASTATIN CALCIUM 40 MG PO TABS
40.0000 mg | ORAL_TABLET | Freq: Every day | ORAL | Status: DC
  Administered 2018-05-21: 40 mg via ORAL
  Filled 2018-05-20: qty 1

## 2018-05-20 MED ORDER — HYDRALAZINE HCL 20 MG/ML IJ SOLN: 5.0000 mg | Freq: Four times a day (QID) | INTRAMUSCULAR | Status: DC | PRN

## 2018-05-20 MED ORDER — ASPIRIN EC 81 MG PO TBEC
81.0000 mg | DELAYED_RELEASE_TABLET | Freq: Every day | ORAL | Status: DC
  Administered 2018-05-21: 81 mg via ORAL
  Filled 2018-05-20: qty 1

## 2018-05-20 MED ORDER — SODIUM CHLORIDE 0.9 % IV SOLN
INTRAVENOUS | Status: DC
  Administered 2018-05-20 – 2018-05-21 (×2): via INTRAVENOUS

## 2018-05-20 MED ORDER — INSULIN ASPART PROT & ASPART (70-30 MIX) 100 UNIT/ML ~~LOC~~ SUSP
25.0000 [IU] | Freq: Two times a day (BID) | SUBCUTANEOUS | Status: DC
  Filled 2018-05-20: qty 10

## 2018-05-20 MED ORDER — GABAPENTIN 300 MG PO CAPS
300.0000 mg | ORAL_CAPSULE | Freq: Two times a day (BID) | ORAL | Status: DC
  Administered 2018-05-20 – 2018-05-21 (×2): 300 mg via ORAL
  Filled 2018-05-20 (×2): qty 1

## 2018-05-20 MED ORDER — INSULIN ASPART 100 UNIT/ML ~~LOC~~ SOLN
0.0000 [IU] | Freq: Three times a day (TID) | SUBCUTANEOUS | Status: DC
  Administered 2018-05-21: 2 [IU] via SUBCUTANEOUS

## 2018-05-20 MED ORDER — ENOXAPARIN SODIUM 30 MG/0.3ML ~~LOC~~ SOLN
30.0000 mg | SUBCUTANEOUS | Status: DC
  Administered 2018-05-20: 30 mg via SUBCUTANEOUS
  Filled 2018-05-20: qty 0.3

## 2018-05-20 NOTE — H&P (Signed)
History and Physical  Christian Jordan ZOX:[id number] DOB: 10/17/1950 DOA: 05/20/2018  Referring physician: EDP PCP: Patient, No Pcp Per   Chief Complaint: presyncope x3 days, hypotension, hypoglycemia  HPI: Christian Jordan is a 67 y.o. male  H/o HTN, insulin dependent dm2 is Sent from church to the ED due to presyncope, blood glucose 58 at the time by EMS. Pt given 25g oral glucose and gatorade. He thinks he is on too much bp meds at home, he has been feeling dizzy, has to hold on to the shopping cart when he goes shopping for the past several months.  He has presyncope x 3 days,  he felt dizzy yesterday when he went fishing with his fried, he had to lay down.   No fever, no loss of consciousness, no confusion, denies chest pain, no palpitation, no nystagmus, no tinnitus, no bowel and bladder incontinence, he denies weight loss, he reports appetite is good. But he did not eat lunch today.  ED course: initial bp is 99/55, no fever, no hypoxia, heart rate regular, wbc 7.3, hgb 12, k 3.4, glucose 135, bun 30/cr 2.02, lft unremarkable, ua no wbc, no rbc, no protein. ekg sinus rhythm, no acute st/t changes, does has poor R wave progression.he received hydration. Orthostatic vital signs unremarkable. hospitalist called for hypoglycemia  And presyncope event.    he reports had unremarkable stress test one and half years ago. He reports he checks blood glucose once a day in the morning, has been around 80. He checks his blood pressure at home regularly as well, sbp in 110's, dbp 60's.  Review of Systems:  Detail per HPI, Review of systems are otherwise negative  Past Medical History:  Diagnosis Date  . Arthritis   . Diabetes mellitus without complication (HCC)   . Hyperlipidemia   . Hypertension    Past Surgical History:  Procedure Laterality Date  . CHOLECYSTECTOMY N/A 04/21/2018   Procedure: LAPAROSCOPIC CHOLECYSTECTOMY;  Surgeon: Jimmye Norman, MD;  Location: [facility] OR;  Service: General;   Laterality: N/A;  . HERNIA REPAIR    . Rotator cuff surgery l    . VASECTOMY     Social History:  reports that he has never smoked. He does not have any smokeless tobacco history on file. He reports that he does not drink alcohol or use drugs. Patient lives at home & is able to participate in activities of daily living independently   No Known Allergies  No family history on file.    Prior to Admission medications   Medication Sig Start Date End Date Taking? Authorizing Provider  acarbose (PRECOSE) 100 MG tablet Take 100 mg by mouth 2 (two) times daily.   Yes [provider]  amLODipine (NORVASC) 2.5 MG tablet Take 2.5 mg by mouth daily.   Yes [provider]  aspirin EC 81 MG tablet Take 81 mg by mouth daily.   Yes [provider]  atorvastatin (LIPITOR) 40 MG tablet Take 40 mg by mouth daily.   Yes [provider]  gabapentin (NEURONTIN) 300 MG capsule Take 300 mg by mouth 2 (two) times daily.   Yes [provider]  insulin aspart protamine- aspart (NOVOLOG MIX 70/30) (70-30) 100 UNIT/ML injection Inject 30 Units into the skin 2 (two) times daily with a meal.   Yes [provider]  lisinopril (PRINIVIL,ZESTRIL) 40 MG tablet Take 40 mg by mouth daily.   Yes [provider]  metFORMIN (GLUCOPHAGE) 1000 MG tablet Take 1,000 mg by mouth 2 (  two) times daily with a meal.   Yes [provider]  naproxen (NAPROSYN) 500 MG tablet Take 500 mg by mouth daily with lunch.    Yes [provider]  acetaminophen (TYLENOL) 325 MG tablet Take 2 tablets (650 mg total) by mouth every 6 (six) hours as needed. Patient not taking: Reported on 05/20/2018 04/20/18   Derwood KaplanNanavati, Ankit, MD  omeprazole (PRILOSEC) 20 MG capsule Take 1 capsule (20 mg total) by mouth daily. Patient not taking: Reported on 05/20/2018 04/20/18   Derwood KaplanNanavati, Ankit, MD  ondansetron (ZOFRAN ODT) 8 MG disintegrating tablet Take 1 tablet (8 mg total) by mouth every 8  (eight) hours as needed for nausea. Patient not taking: Reported on 05/20/2018 04/20/18   Derwood KaplanNanavati, Ankit, MD  oxyCODONE (OXY IR/ROXICODONE) 5 MG immediate release tablet Take 1 tablet (5 mg total) by mouth every 6 (six) hours as needed for moderate pain. Patient not taking: Reported on 05/20/2018 04/22/18   Gaynelle AduWilson, Eric, MD    Physical Exam: BP (!) 141/77   Pulse 72   Temp (!) 97.5 F (36.4 C) (Oral)   Resp 16   SpO2 100%   General:  NAD Eyes: PERRL ENT: unremarkable Neck: supple, no JVD Cardiovascular: RRR Respiratory: CTABL Abdomen: soft/NT/ND, positive bowel sounds Skin: no rash Musculoskeletal:  No edema Psychiatric: calm/cooperative Neurologic: no focal findings            Labs on Admission:  Basic Metabolic Panel: Recent Labs  Lab 05/20/18 1208  NA 139  K 3.4*  CL 103  CO2 23  GLUCOSE 135*  BUN 30*  CREATININE 2.02*  CALCIUM 9.0   Liver Function Tests: Recent Labs  Lab 05/20/18 1208  AST 19  ALT 25  ALKPHOS 65  BILITOT 0.4  PROT 7.1  ALBUMIN 3.7   No results for input(s): LIPASE, AMYLASE in the last 168 hours. No results for input(s): AMMONIA in the last 168 hours. CBC: Recent Labs  Lab 05/20/18 1208  WBC 7.3  NEUTROABS 5.3  HGB 12.0*  HCT 36.7*  MCV 93.1  PLT 239   Cardiac Enzymes: No results for input(s): CKTOTAL, CKMB, CKMBINDEX, TROPONINI in the last 168 hours.  BNP (last 3 results) No results for input(s): BNP in the last 8760 hours.  ProBNP (last 3 results) No results for input(s): PROBNP in the last 8760 hours.  CBG: Recent Labs  Lab 05/20/18 1202 05/20/18 1315  GLUCAP 157* 90    Radiological Exams on Admission: No results found.   Assessment/Plan Present on Admission: . Hypoglycemia    Presyncope -with hypoglycemia, borderline bp, appear dehydrated -obs /tele, will get echocardiogram for completeness -continue hydration  Hypoglycemia: He did not eat lunch Corrected Hydration, reduce home insulin  dose Continue hypoglycemia protocol  Insulin dependent dm2 with peripheral neuropathy Recent a1c 5.8 Report has been on novolog 70/30 30units BID for two yrs Reduced to 25unit bid, continue neurontin Hold metformin, hold acarbose Start ssi  AKI on CKD [name] -ua no wbc, no rbc, no protein -likely from dehydration -hydration, hold metformin, discontinue narprosyn, hold lisinopril today, start lisonopril on reduced does from tomorrow -repeat bmp in am   HTN -bp low normal on presentation -hold norvasc, reduce lisinopril dose -prn hydralazine   HLD; continue statin   Anemia? hgb 12 on presentation, (hgb 13 in 03/2018) Anemia work up, repeat cbc in am.    DVT prophylaxis: lovenox   Consultants: none  Code Status: full   Family Communication:  Patient and family  Disposition  Plan: med tele obs, likely able to d/c in am [facility]  Time spent:  Albertine Grates MD, PhD [facility] Pager [phone] If 7PM-7AM, please contact night-coverage at [email or web address], password [facility]

## 2018-05-20 NOTE — ED Notes (Signed)
[facility] reports [facility].  Pt checked BP this morning before taking meds and BP 114/68.   Pt reports this is in usual range of what BP is prior to taking BP medication.

## 2018-05-20 NOTE — ED Triage Notes (Signed)
Pt [place] [facility] from [facility] for syncopal episode possibly related to hypoglycemia. Pt states he felt like his blood sugar was low- per EMS on scene it was 56. Pt given 25g oral glucose and gatorade. Pt A+Ox4 and in NAD at this time. Pt had surgery on his gallbladder x6 weeks ago, states he has had near syncopal episodes daily since the surgery. Pt last CBG 75.

## 2018-05-20 NOTE — Progress Notes (Signed)
Call out to MD to verify administration of novolog 70/30. Patient blood sugar was 61 after eating dinner.  Alert and oriented. Patient stated he only got a [place] sandwich for lunch.  Will have next shift check blood sugar and possibly give insulin.

## 2018-05-20 NOTE — ED Provider Notes (Signed)
[facility] Provider Note   CSN: [id number] Arrival date & time: 05/20/18  1111     History   Chief Complaint Chief Complaint  Patient presents with  . Loss of Consciousness    HPI Christian Jordan is a 67 y.o. male.  The history is provided by the patient and medical records. No language interpreter was used.     67 year old male with history of diabetes, on insulin, hypertension, hyperlipidemia brought here via EMS from church for a near syncopal episode.  Patient report for the past few days he has had near syncopal episode with lightheadedness and dizziness upon exertion.  He has been sleeping well.  This morning he went to church but did ate breakfast prior.  He felt a bit woozy and upon walking into the church, he was unstable on his feet, felt lightheadedness and has had help to guide him to a chair.  EMS was called and patient was brought here.  He was noted to have a hypoglycemic episode with initial CBG of 56.  He received 25 mg of oral glucose and Gatorade.  Patient currently denies any active pain.  He denies headache, chest pain, trouble breathing, abdominal pain, back pain, nausea vomiting diarrhea, numbness or focal weakness.  He felt that his blood pressure medication may be a bit too strong for him but denies any changes in his medication recently.  He did take his insulin last night and his blood pressure medication this morning.  He did eat breakfast.  He mentioned having near syncopal episode like this in the past which was recurrent.  He had a gallbladder surgery over a month ago.  He denies any recent alcohol or drug use.  Past Medical History:  Diagnosis Date  . Arthritis   . Diabetes mellitus without complication (HCC)   . Hyperlipidemia   . Hypertension     Patient Active Problem List   Diagnosis Date Noted  . Cholecystitis 04/21/2018    Past Surgical History:  Procedure Laterality Date  . CHOLECYSTECTOMY N/A 04/21/2018    Procedure: LAPAROSCOPIC CHOLECYSTECTOMY;  Surgeon: Jimmye NormanWyatt, Armando, MD;  [facility];  Service: General;  Laterality: N/A;  . HERNIA REPAIR    . Rotator cuff surgery l    . VASECTOMY          Home Medications    Prior to Admission medications   Medication Sig Start Date End Date Taking? Authorizing Provider  acarbose (PRECOSE) 100 MG tablet Take 100 mg by mouth 2 (two) times daily.    [provider]  acetaminophen (TYLENOL) 325 MG tablet Take 2 tablets (650 mg total) by mouth every 6 (six) hours as needed. Patient taking differently: Take 650 mg by mouth every 6 (six) hours as needed for mild pain or headache.  04/20/18   Derwood KaplanNanavati, Ankit, MD  amLODipine (NORVASC) 2.5 MG tablet Take 2.5 mg by mouth daily.    [provider]  aspirin EC 81 MG tablet Take 81 mg by mouth daily.    [provider]  atorvastatin (LIPITOR) 40 MG tablet Take 40 mg by mouth daily.    [provider]  docusate sodium (COLACE) 100 MG capsule Take 100 mg by mouth daily.    [provider]  gabapentin (NEURONTIN) 300 MG capsule Take 300 mg by mouth 2 (two) times daily.    [provider]  insulin aspart protamine- aspart (NOVOLOG MIX 70/30) (70-30) 100 UNIT/ML injection Inject 30 Units into the skin  2 (two) times daily with a meal.    [provider]  lisinopril (PRINIVIL,ZESTRIL) 40 MG tablet Take 40 mg by mouth daily.    [provider]  metFORMIN (GLUCOPHAGE) 1000 MG tablet Take 1,000 mg by mouth 2 (two) times daily with a meal.    [provider]  naproxen (NAPROSYN) 500 MG tablet Take 500 mg by mouth daily with lunch.     [provider]  omeprazole (PRILOSEC) 20 MG capsule Take 1 capsule (20 mg total) by mouth daily. 04/20/18   [name], MD  ondansetron (ZOFRAN ODT) 8 MG disintegrating tablet Take 1 tablet (8 mg total) by mouth every 8 (eight) hours as needed for nausea. 04/20/18   [name], MD  oxyCODONE  (OXY IR/ROXICODONE) 5 MG immediate release tablet Take 1 tablet (5 mg total) by mouth every 6 (six) hours as needed for moderate pain. 04/22/18   Gaynelle Adu, MD    Family History No family history on file.  Social History Social History   Tobacco Use  . Smoking status: Never Smoker  Substance Use Topics  . Alcohol use: No  . Drug use: No     Allergies   Patient has no known allergies.   Review of Systems Review of Systems  All other systems reviewed and are negative.    Physical Exam Updated Vital Signs BP (!) 99/55   Pulse 69   Temp 97.9 F (36.6 C) (Oral)   Resp 11   SpO2 97%   Physical Exam  Constitutional: He is oriented to person, place, and time. He appears well-developed and well-nourished. No distress.  HENT:  Head: Atraumatic.  Mouth/Throat: Oropharynx is clear and moist.  Eyes: Conjunctivae are normal.  Neck: Normal range of motion. Neck supple.  Cardiovascular: Normal rate and regular rhythm.  Pulmonary/Chest: Effort normal and breath sounds normal.  Abdominal: Soft. Bowel sounds are normal. He exhibits no distension. There is no tenderness.  Musculoskeletal:  5 out of 5 strength all 4 extremities.  Neurological: He is alert and oriented to person, place, and time.  Skin: No rash noted.  Psychiatric: He has a normal mood and affect.  Nursing note and vitals reviewed.    ED Treatments / Results  Labs (all labs ordered are listed, but only abnormal results are displayed) Labs Reviewed  CBC WITH DIFFERENTIAL/PLATELET - Abnormal; Notable for the following components:      Result Value   RBC 3.94 (*)    Hemoglobin 12.0 (*)    HCT 36.7 (*)    All other components within normal limits  COMPREHENSIVE METABOLIC PANEL - Abnormal; Notable for the following components:   Potassium 3.4 (*)    Glucose, Bld 135 (*)    BUN 30 (*)    Creatinine, Ser 2.02 (*)    GFR calc non Af Amer 32 (*)    GFR calc Af Amer 37 (*)    All other components within  normal limits  URINALYSIS, ROUTINE W REFLEX MICROSCOPIC - Abnormal; Notable for the following components:   APPearance HAZY (*)    All other components within normal limits  CBG MONITORING, ED - Abnormal; Notable for the following components:   Glucose-Capillary 157 (*)    All other components within normal limits  CBG MONITORING, ED    EKG EKG Interpretation  Date/Time:  Sunday May 20 2018 11:20:47 EDT Ventricular Rate:  69 PR Interval:    QRS Duration: 100 QT Interval:  398 QTC Calculation: 427 R Axis:   -  44 Text Interpretation:  Sinus rhythm Abnormal R-wave progression, late transition Inferior infarct, old Confirmed by Benjiman Core [phone]) on 05/20/2018 2:47:52 PM   Radiology No results found.  Procedures Procedures (including critical care time)  EMERGENCY DEPARTMENT  US GUIDANCE EXAM Emergency Ultrasound:  US Guidance for Needle Guidance  INDICATIONS: Difficult vascular access Linear probe used in real-time to visualize location of needle entry through skin.   PERFORMED BY: Myself IMAGES ARCHIVED?: No LIMITATIONS: Pain VIEWS USED: Transverse INTERPRETATION: Right arm and Needle gauge 20   Medications Ordered in ED Medications  sodium chloride 0.9 % bolus 1,000 mL (1,000 mLs Intravenous New Bag/Given 05/20/18 1439)     Initial Impression / Assessment and Plan / ED Course  I have reviewed the triage vital signs and the nursing notes.  Pertinent labs & imaging results that were available during my care of the patient were reviewed by me and considered in my medical decision making (see chart for details).     BP (!) 118/52   Pulse 72   Temp 97.9 F (36.6 C) (Oral)   Resp 16   SpO2 97%    Final Clinical Impressions(s) / ED Diagnoses   Final diagnoses:  Near syncope  Hypoglycemia  Renal insufficiency    ED Discharge Orders    None     12:35 PM Patient here with near syncope.  EMS noted that he was hypoglycemic with initial CBG of  56.  He was given oral glucose and Gatorade and CBG has since improved.  No active chest pain no other complaint. Pt does mentioned he hasn't eating much for the past few days. Blood pressure noted to be low at 99/55.  He is on 2 blood pressure medications, amlodipine 2.5 mg, as well as lisinopril 40 mg.  He has been compliant with his medication.  Will check orthostatic vital sign, work-up initiated, care discussed with Dr. Rubin Payor.   1:34 PM Patient was symptomatic during orthostatic vital sign.  Initially when he first came in to the ER, CBG was 157, on recheck it is 90.  He is on insulin therefore this will need to be monitored very closely.  Evidence of renal insufficiency with a creatinine of 2.02, which is a bump from prior value, IVF ordered.  2:35 PM Difficult to access IV, I was able to placed peripheral IV using [place].  Pt will receive IVF.   Will request admission for hypoglycemia and orthostatic hypotension.     3:04 PM Appreciate consultation from [facility] who agrees to see and admit pt for further management of his condition. EKG without arrhythmia and no vertigo sxs.    Fayrene Helper, PA-C 05/20/18 1505    Benjiman Core, MD 05/20/18 [phone]

## 2018-05-20 NOTE — Progress Notes (Signed)
Patient states he would not feel comfortable taking insulin with blood sugar of 61.  Patient previously had blood sugar today of 56. Night shift will be checking blood sugar and observing patient.

## 2018-05-20 NOTE — ED Notes (Addendum)
Note made in error

## 2018-05-21 ENCOUNTER — Ambulatory Visit (HOSPITAL_BASED_OUTPATIENT_CLINIC_OR_DEPARTMENT_OTHER): Payer: Non-veteran care

## 2018-05-21 DIAGNOSIS — I361 Nonrheumatic tricuspid (valve) insufficiency: Secondary | ICD-10-CM

## 2018-05-21 DIAGNOSIS — R55 Syncope and collapse: Secondary | ICD-10-CM | POA: Diagnosis not present

## 2018-05-21 DIAGNOSIS — E162 Hypoglycemia, unspecified: Secondary | ICD-10-CM | POA: Diagnosis not present

## 2018-05-21 DIAGNOSIS — Z794 Long term (current) use of insulin: Secondary | ICD-10-CM

## 2018-05-21 DIAGNOSIS — E119 Type 2 diabetes mellitus without complications: Secondary | ICD-10-CM

## 2018-05-21 DIAGNOSIS — N179 Acute kidney failure, unspecified: Secondary | ICD-10-CM | POA: Diagnosis not present

## 2018-05-21 LAB — IRON AND TIBC
IRON: 87 ug/dL (ref 45–182)
Saturation Ratios: 34 % (ref 17.9–39.5)
TIBC: 259 ug/dL (ref 250–450)
UIBC: 172 ug/dL

## 2018-05-21 LAB — CBC
HCT: 36.8 % — ABNORMAL LOW (ref 39.0–52.0)
Hemoglobin: 12.4 g/dL — ABNORMAL LOW (ref 13.0–17.0)
MCH: 30.8 pg (ref 26.0–34.0)
MCHC: 33.7 g/dL (ref 30.0–36.0)
MCV: 91.5 fL (ref 78.0–100.0)
PLATELETS: 197 10*3/uL (ref 150–400)
RBC: 4.02 MIL/uL — AB (ref 4.22–5.81)
RDW: 12.8 % (ref 11.5–15.5)
WBC: 5.9 10*3/uL (ref 4.0–10.5)

## 2018-05-21 LAB — GLUCOSE, CAPILLARY
Glucose-Capillary: 98 mg/dL (ref 70–99)
Glucose-Capillary: 144 mg/dL — ABNORMAL HIGH (ref 70–99)
Glucose-Capillary: 98 mg/dL (ref 70–99)

## 2018-05-21 LAB — ECHOCARDIOGRAM COMPLETE
Height: 74 in
Weight: 3705.6 oz

## 2018-05-21 LAB — TSH: TSH: 3.35 u[IU]/mL (ref 0.350–4.500)

## 2018-05-21 LAB — RETICULOCYTES
RBC.: 4.02 MIL/uL — AB (ref 4.22–5.81)
Retic Count, Absolute: 48.2 10*3/uL (ref 19.0–186.0)
Retic Ct Pct: 1.2 % (ref 0.4–3.1)

## 2018-05-21 LAB — FOLATE: Folate: 6.8 ng/mL (ref >5.9)

## 2018-05-21 LAB — BASIC METABOLIC PANEL
ANION GAP: 11 (ref 5–15)
BUN: 16 mg/dL (ref 8–23)
CALCIUM: 9 mg/dL (ref 8.9–10.3)
CHLORIDE: 103 mmol/L (ref 98–111)
CO2: 26 mmol/L (ref 22–32)
CREATININE: 1.09 mg/dL (ref 0.61–1.24)
Glucose, Bld: 106 mg/dL — ABNORMAL HIGH (ref 70–99)
POTASSIUM: 4.6 mmol/L (ref 3.5–5.1)
SODIUM: 140 mmol/L (ref 135–145)

## 2018-05-21 LAB — FERRITIN: Ferritin: 98 ng/mL (ref 24–336)

## 2018-05-21 LAB — VITAMIN B12: Vitamin B-12: 227 pg/mL (ref 180–914)

## 2018-05-21 MED ORDER — INSULIN ASPART PROT & ASPART (70-30 MIX) 100 UNIT/ML ~~LOC~~ SUSP: 15.0000 [IU] | Freq: Two times a day (BID) | SUBCUTANEOUS | 0 refills | Status: AC

## 2018-05-21 MED ORDER — LISINOPRIL 40 MG PO TABS: 20.0000 mg | ORAL_TABLET | Freq: Every day | ORAL | 0 refills | Status: AC

## 2018-05-21 MED ORDER — ENOXAPARIN SODIUM 40 MG/0.4ML ~~LOC~~ SOLN: 40.0000 mg | SUBCUTANEOUS | Status: DC

## 2018-05-21 NOTE — Discharge Instructions (Signed)

## 2018-05-21 NOTE — Discharge Summary (Signed)
Physician Discharge Summary  Christian Jordan Christian WUJ:[id number] DOB: 1951/05/23  [facility]  Admit date: 05/20/2018 Discharge date: 05/21/2018  Recommendations for Outpatient Follow-up:  1. [facility]CP or [facility].  To be seen with repeat labs (CBC & BMP).  Home Health: None Equipment/Devices: None    Discharge Condition: Improved and stable  CODE STATUS: Full  Diet recommendation: Heart Healthy & Diabetic diet.  Discharge Diagnoses:  Active Problems:   Hypoglycemia   Brief Summary: 67 year old male, PMH of type II DM/IDDM, HTN, HLD, [facility]/20/2018 via EMS from church for a near syncopal episode.  He reports chronic, several months (8-10) history of dizziness which has gotten worse lately over the last 3 to 4 weeks.  He indicates that this usually occurs in the mornings, an hour or so after breakfast and morning dose of insulins, no vertigo and no syncopal episodes.  He has had 3 such near syncopal episodes recently.  He reports checking his blood sugar only once/fasting and has been low up to 50s 3 times per week.  There have been no recent changes in insulin doses and he has not seen his PCP recently to discuss these complaints.  Also as per diabetes coordinator's discussion with him today, patient at times takes his insulins an our or so after a meal.  On day of admission, he ate breakfast and went to church.  He felt a bit woozy upon walking into church, he was unstable on his feet, felt lightheaded and had to be guided to a chair.  EMS was called and patient was brought to the ED.  He was noted to have a hypoglycemic episode with initial CBG of 56.  He received 25 mg of oral glucose and Gatorade.  He reported that he may have been on too many blood pressure medications but denied any changes in medications recently.  He reported normal oral intake without nausea, vomiting,  diarrhea.  He denied chest pain, palpitations, dyspnea or strokelike symptoms.  Initial blood pressure in the ED 99/55 mmHg.  Orthostatic blood pressure checks were negative.  He reported unremarkable stress test a year and a half ago.  Assessment and plan:  1. Near syncope: Likely multifactorial related to diabetes with hypoglycemia and hypotension related to antihypertensives.  Orthostatic blood pressures negative.  Telemetry shows sinus rhythm without arrhythmias.  TTE shows normal EF more detailed report as below.  Asymptomatic with ambulation at this time.  Managed IDDM and hypertension as below. 2. Type II DM/IDDM with frequent hypoglycemic episodes: Likely related to higher dose of insulins and not eating at the appropriate times related to his insulin administration.  70/30 insulin was reduced from 30 units to 15 units twice daily and continue prior home dose of Actos and Metformin.  Patient counseled extensively by diabetes coordinator regarding checking CBGs 3-4 times daily, taking insulin with a meal. A1C 5.8.  Patient advised to follow-up closely with [facility]. 3. Diabetic peripheral neuropathy: Continue Neurontin. 4. Acute on stage [name] chronic kidney disease: Unsure if he has stage II or stage [name] chronic kidney disease.  Presented with creatinine of 2.02.  Discontinued NSAIDs.  Held lisinopril temporarily.  Hydrated with IV fluid.  Resolved.  [facility]. 5. Essential hypertension: SBP in the 90s on arrival.  Discontinued amlodipine and reduced lisinopril from 40 mg to 20 mg daily.  Close outpatient follow-up.  6. Hyperlipidemia: Continue statins. 7. Hypokalemia: Replaced. 8. Mild anemia: May be multifactorial related to iron deficiency and possible B12 deficiency.  Outpatient follow-up with his PCP for further evaluation and management as deemed necessary.   Consultations:  None  Procedures:  TTE 05/21/2018: Study Conclusions  - Left ventricle:  The cavity size was normal. Wall thickness was   normal. Systolic function was normal. The estimated ejection   fraction was in the range of 60% to 65%. Wall motion was normal;   there were no regional wall motion abnormalities. Features are   consistent with a pseudonormal left ventricular filling pattern,   with concomitant abnormal relaxation and increased filling   pressure (grade 2 diastolic dysfunction). - Right atrium: The atrium was mildly dilated.   Discharge Instructions  Discharge Instructions    Call MD for:   Complete by:  As directed    Feeling like passing out.   Call MD for:  extreme fatigue   Complete by:  As directed    Call MD for:  persistant dizziness or light-headedness   Complete by:  As directed    Diet - low sodium heart healthy   Complete by:  As directed    Diet Carb Modified   Complete by:  As directed    Increase activity slowly   Complete by:  As directed        Medication List    STOP taking these medications   acetaminophen 325 MG tablet Commonly known as:  TYLENOL   amLODipine 2.5 MG tablet Commonly known as:  NORVASC   omeprazole 20 MG capsule Commonly known as:  PRILOSEC   ondansetron 8 MG disintegrating tablet Commonly known as:  ZOFRAN-ODT   oxyCODONE 5 MG immediate release tablet Commonly known as:  Oxy IR/ROXICODONE     TAKE these medications   acarbose 100 MG tablet Commonly known as:  PRECOSE Take 100 mg by mouth 2 (two) times daily.   aspirin EC 81 MG tablet Take 81 mg by mouth daily.   atorvastatin 40 MG tablet Commonly known as:  LIPITOR Take 40 mg by mouth daily.   gabapentin 300 MG capsule Commonly known as:  NEURONTIN Take 300 mg by mouth 2 (two) times daily.   insulin aspart protamine- aspart (70-30) 100 UNIT/ML injection Commonly known as:  NOVOLOG MIX 70/30 Inject 0.15 mLs (15 Units total) into the skin 2 (two) times daily with a meal. What changed:  how much to take   lisinopril 40 MG  tablet Commonly known as:  PRINIVIL,ZESTRIL Take 0.5 tablets (20 mg total) by mouth daily. What changed:  how much to take   metFORMIN 1000 MG tablet Commonly known as:  GLUCOPHAGE Take 1,000 mg by mouth 2 (two) times daily with a meal.   naproxen 500 MG tablet Commonly known as:  NAPROSYN Take 500 mg by mouth daily with lunch.      Follow-up Information    Family Physician/Endocrinologist (Diabetes Specialitist). Schedule an appointment as soon as possible for a visit in 3 day(s).   Why:  Follow up re management of Diabetes and High BP. To be seen with repeat labs (CBC & BMP). Contact information: At the [place], [place]         No Known Allergies    Procedures/Studies: No results found.    Subjective: Says that he is feeling much better and anxious to go home.  Denies headache, dizziness, lightheadedness, vertigo, chest pain, dyspnea, palpitations, strokelike symptoms.  Denies any complaints.  Discharge Exam:  Vitals:   05/21/18 0500 05/21/18 0601 05/21/18 1032 05/21/18 1526  BP:  (!) 143/74 120/63 120/66  Pulse:  66 69 60  Resp:  18 16 18   Temp:  97.9 F (36.6 C) 97.8 F (36.6 C) 97.9 F (36.6 C)  TempSrc:  Oral Oral Oral  SpO2:  97% 100% 96%  Weight: 105.1 kg     Height:        General: Pleasant middle-aged male, moderately built and nourished, sitting up comfortably in chair. Cardiovascular: S1 & S2 heard, RRR, S1/S2 +. No murmurs, rubs, gallops or clicks. No JVD or pedal edema.  Telemetry personally reviewed: Sinus rhythm. Respiratory: Clear to auscultation without wheezing, rhonchi or crackles. No increased work of breathing. Abdominal:  Non distended, non tender & soft. No organomegaly or masses appreciated. Normal bowel sounds heard. CNS: Alert and oriented. No focal deficits. Extremities: no edema, no cyanosis    The results of significant diagnostics from this hospitalization (including imaging, microbiology, ancillary and laboratory) are  listed below for reference.     Labs: CBC: Recent Labs  Lab 05/20/18 1208 05/21/18 0703  WBC 7.3 5.9  NEUTROABS 5.3  --   HGB 12.0* 12.4*  HCT 36.7* 36.8*  MCV 93.1 91.5  PLT 239 197   Basic Metabolic Panel: Recent Labs  Lab 05/20/18 1208 05/21/18 0703  NA 139 140  K 3.4* 4.6  CL 103 103  CO2 23 26  GLUCOSE 135* 106*  BUN 30* 16  CREATININE 2.02* 1.09  CALCIUM 9.0 9.0   Liver Function Tests: Recent Labs  Lab 05/20/18 1208  AST 19  ALT 25  ALKPHOS 65  BILITOT 0.4  PROT 7.1  ALBUMIN 3.7   CBG: Recent Labs  Lab 05/20/18 1840 05/20/18 2120 05/21/18 0733 05/21/18 1157 05/21/18 1646  GLUCAP 61* 103* 98 144* 98   Thyroid function studies Recent Labs    05/21/18 0703  TSH 3.350   Anemia work up Recent Labs    05/21/18 0703  VITAMINB12 227  FOLATE 6.8  FERRITIN 98  TIBC 259  IRON 87  RETICCTPCT 1.2   Urinalysis    Component Value Date/Time   COLORURINE YELLOW 05/20/2018 1245   APPEARANCEUR HAZY (A) 05/20/2018 1245   LABSPEC 1.018 05/20/2018 1245   PHURINE 5.0 05/20/2018 1245   GLUCOSEU NEGATIVE 05/20/2018 1245   HGBUR NEGATIVE 05/20/2018 1245   BILIRUBINUR NEGATIVE 05/20/2018 1245   BILIRUBINUR neg 02/25/2013 1226   KETONESUR NEGATIVE 05/20/2018 1245   PROTEINUR NEGATIVE 05/20/2018 1245   UROBILINOGEN 2.0 02/25/2013 1226   NITRITE NEGATIVE 05/20/2018 1245   LEUKOCYTESUR NEGATIVE 05/20/2018 1245      Time coordinating discharge: 40 minutes  SIGNED:  Marcellus Scott, MD, FACP, [facility]. [facility] Pager [phone]  If 7PM-7AM, please contact night-coverage [email or web address] Password [facility] 05/21/2018, 5:39 PM

## 2018-05-21 NOTE — Progress Notes (Signed)
  Echocardiogram 2D Echocardiogram has been performed.  Christian Jordan 05/21/2018, 9:56 AM

## 2019-11-30 IMAGING — US US ABDOMEN LIMITED
1 series · 14 of 25 positions shown · non-contrast
Comparison: None.

CLINICAL DATA: Abdominal pain today.

EXAM:
ULTRASOUND ABDOMEN LIMITED RIGHT UPPER QUADRANT

[Series 1: us abdomen limited · 0.26mm/px · 14 of 60 slices shown]
[im 1/60]
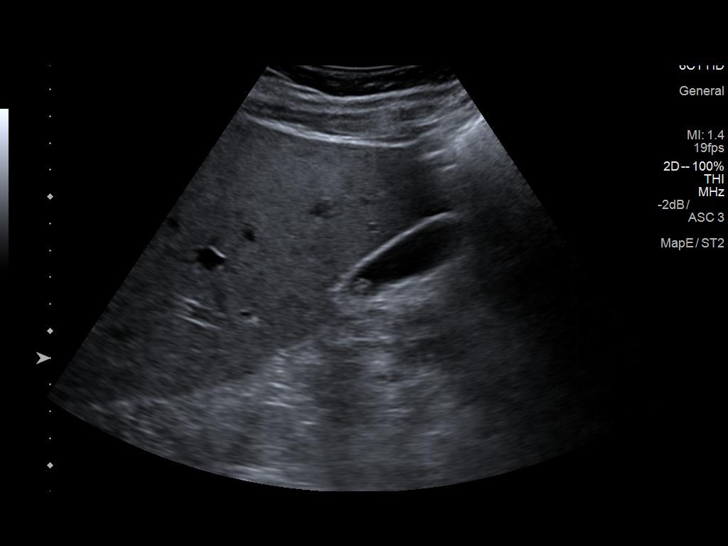
[im 5/60]
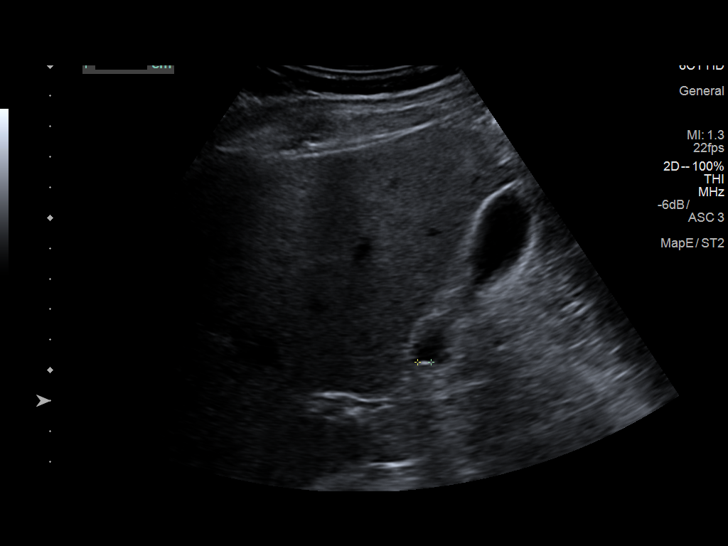
[im 10/60]
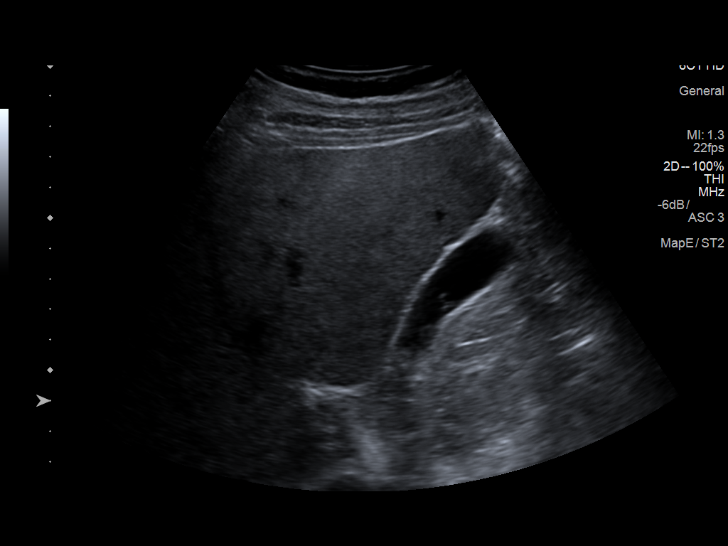
[im 15/60]
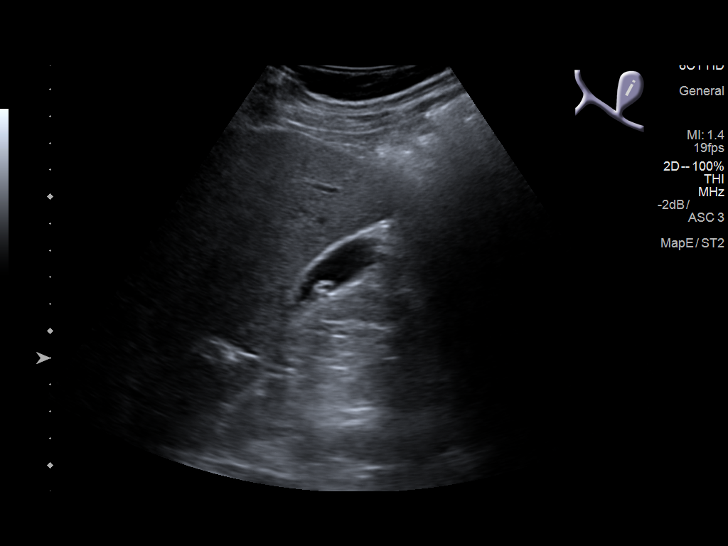
[im 20/60]
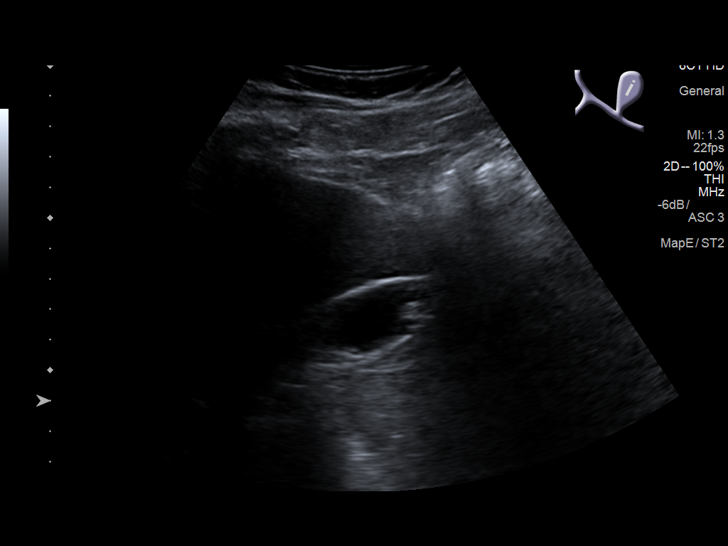
[im 23/60]
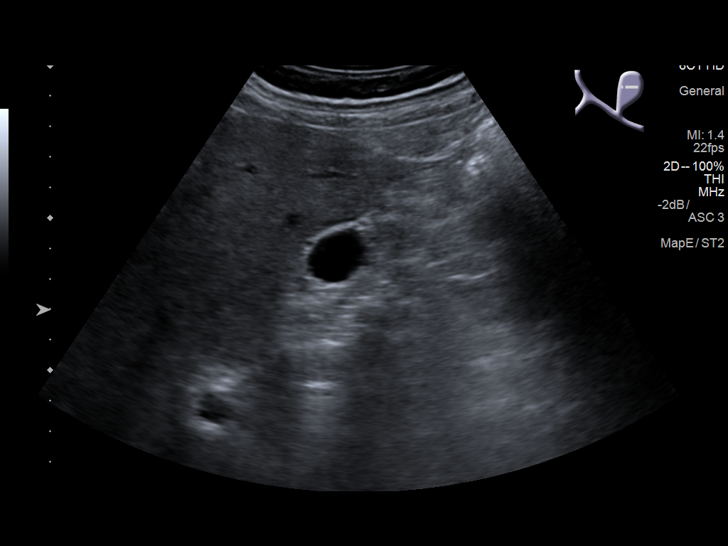
[im 28/60]
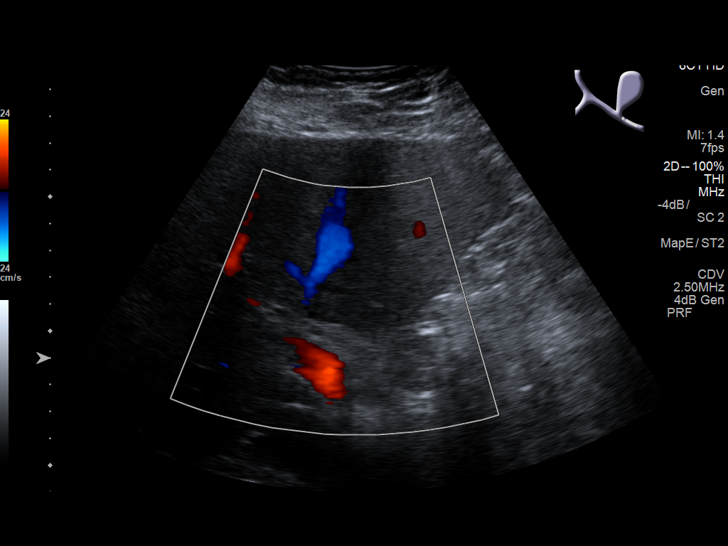
[im 32/60]
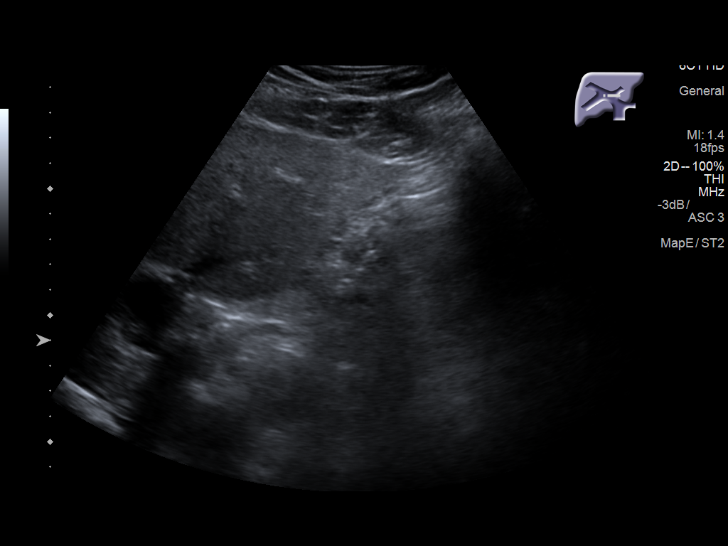
[im 37/60]
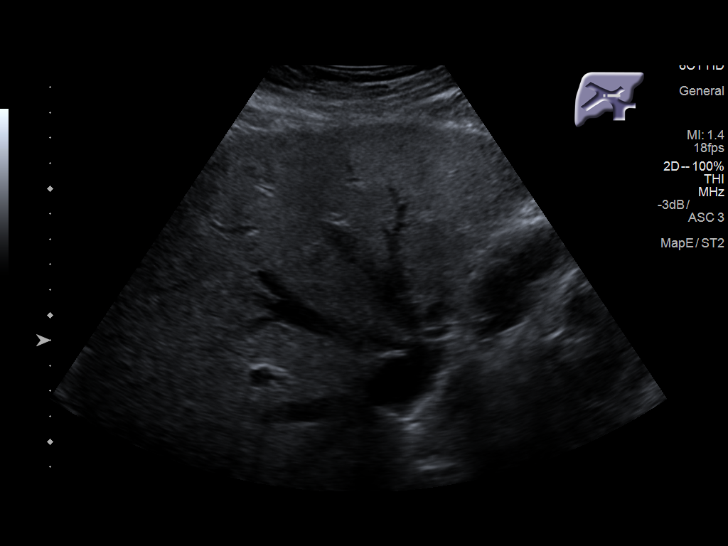
[im 40/60]
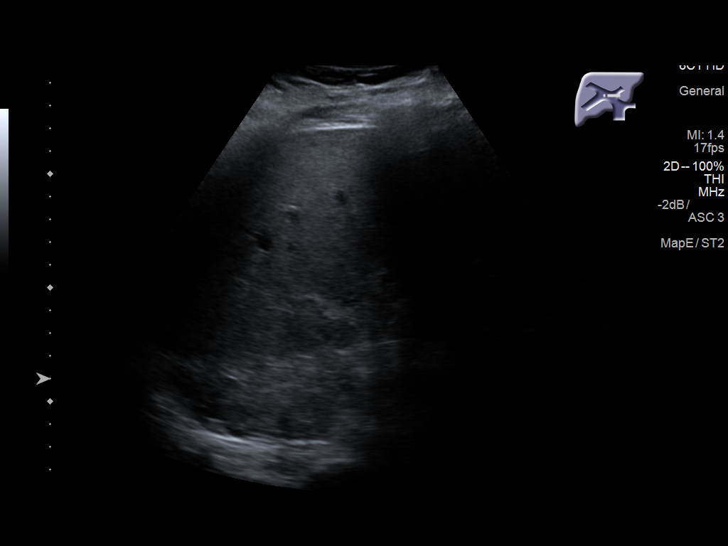
[im 45/60]
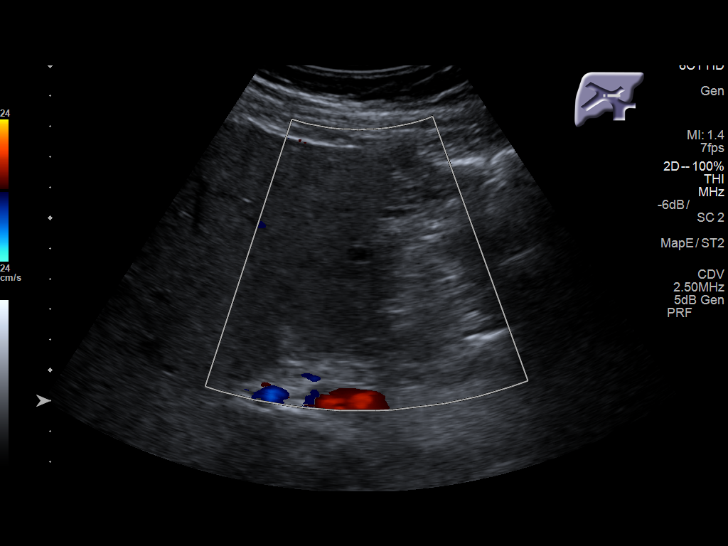
[im 50/60]
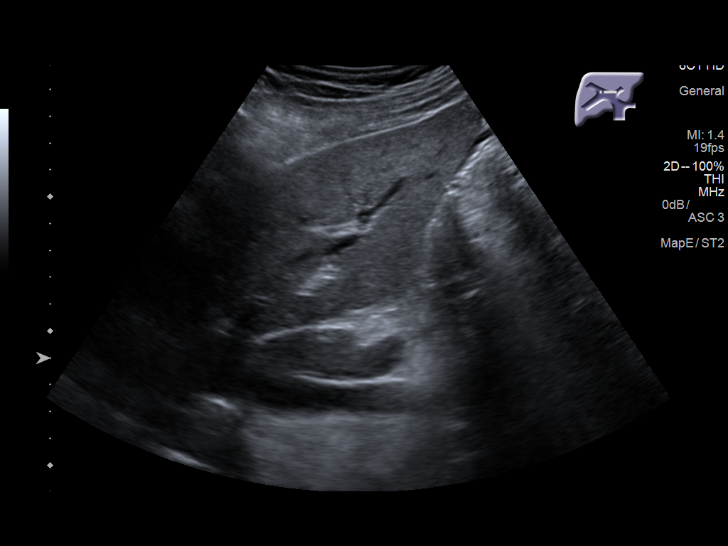
[im 55/60]
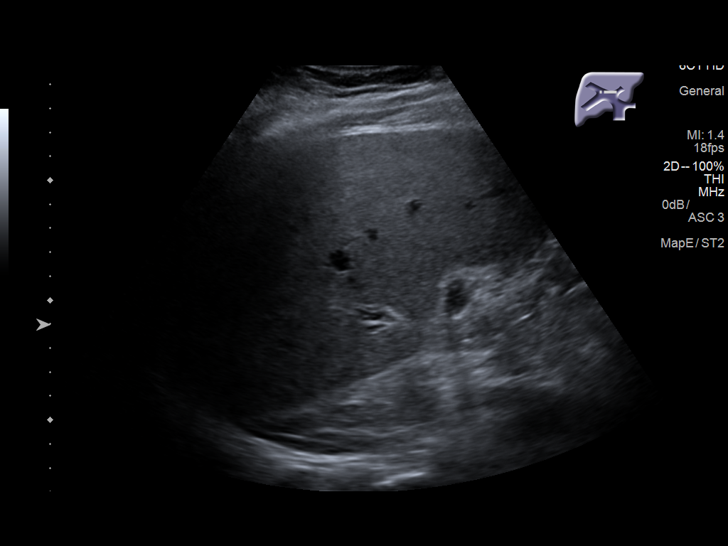
[im 60/60]
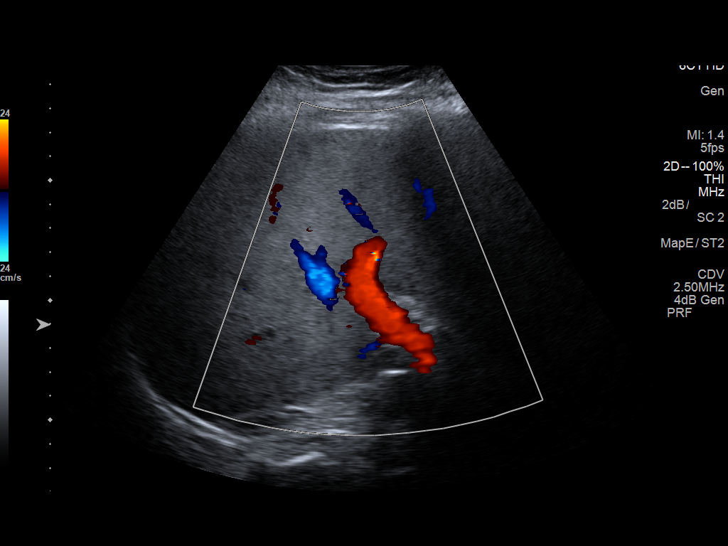

[14 of 25 positions shown; findings below may reference images not displayed]

FINDINGS: Gallbladder:

Physiologically distended containing multiple mobile gallstones,
largest 7 mm. Small 2 mm polyp versus adherent stone. No
pericholecystic fluid or gallbladder wall thickening. No sonographic
Murphy sign noted by sonographer.

Common bile duct:

Diameter: 4 mm.

Liver:

Subcentimeter cyst in the right hepatic lobe. No suspicious lesion.
Within normal limits in parenchymal echogenicity. Portal vein is
patent on color Doppler imaging with normal direction of blood flow
towards the liver.
IMPRESSION: 1. Gallstones and tiny gallbladder polyp. No sonographic findings of
cholecystitis. No biliary dilatation.
2. Incidental subcentimeter hepatic cyst.

## 2022-10-02 ENCOUNTER — Other Ambulatory Visit: Payer: Self-pay

## 2022-10-02 ENCOUNTER — Encounter (HOSPITAL_COMMUNITY): Payer: Self-pay | Admitting: Emergency Medicine

## 2022-10-02 ENCOUNTER — Emergency Department (HOSPITAL_COMMUNITY)
Admission: EM | Admit: 2022-10-02 | Discharge: 2022-10-02 | Disposition: A | Discharge status: Home / Self Care | Payer: No Typology Code available for payment source | Attending: Emergency Medicine | Admitting: Emergency Medicine

## 2022-10-02 DIAGNOSIS — Z7982 Long term (current) use of aspirin: Secondary | ICD-10-CM | POA: Diagnosis not present

## 2022-10-02 DIAGNOSIS — Z7984 Long term (current) use of oral hypoglycemic drugs: Secondary | ICD-10-CM | POA: Diagnosis not present

## 2022-10-02 DIAGNOSIS — R55 Syncope and collapse: Secondary | ICD-10-CM | POA: Diagnosis present

## 2022-10-02 DIAGNOSIS — E162 Hypoglycemia, unspecified: Secondary | ICD-10-CM

## 2022-10-02 DIAGNOSIS — Z794 Long term (current) use of insulin: Secondary | ICD-10-CM | POA: Diagnosis not present

## 2022-10-02 DIAGNOSIS — E11649 Type 2 diabetes mellitus with hypoglycemia without coma: Secondary | ICD-10-CM | POA: Diagnosis not present

## 2022-10-02 LAB — COMPREHENSIVE METABOLIC PANEL
ALT: 14 U/L (ref 0–44)
AST: 18 U/L (ref 15–41)
Albumin: 4 g/dL (ref 3.5–5.0)
Alkaline Phosphatase: 47 U/L (ref 38–126)
Anion gap: 11 (ref 5–15)
BUN: 15 mg/dL (ref 8–23)
CO2: 24 mmol/L (ref 22–32)
Calcium: 9.1 mg/dL (ref 8.9–10.3)
Chloride: 107 mmol/L (ref 98–111)
Creatinine, Ser: 1.25 mg/dL — ABNORMAL HIGH (ref 0.61–1.24)
GFR, Estimated: >60 mL/min (ref >60)
Glucose, Bld: 167 mg/dL — ABNORMAL HIGH (ref 70–99)
Potassium: 4.6 mmol/L (ref 3.5–5.1)
Sodium: 142 mmol/L (ref 135–145)
Total Bilirubin: 0.9 mg/dL (ref 0.3–1.2)
Total Protein: 7.4 g/dL (ref 6.5–8.1)

## 2022-10-02 LAB — CBC
HCT: 46.9 % (ref 39.0–52.0)
Hemoglobin: 15.4 g/dL (ref 13.0–17.0)
MCH: 30.4 pg (ref 26.0–34.0)
MCHC: 32.8 g/dL (ref 30.0–36.0)
MCV: 92.7 fL (ref 80.0–100.0)
Platelets: 218 10*3/uL (ref 150–400)
RBC: 5.06 MIL/uL (ref 4.22–5.81)
RDW: 13.2 % (ref 11.5–15.5)
WBC: 9 10*3/uL (ref 4.0–10.5)
nRBC: 0 % (ref 0.0–0.2)

## 2022-10-02 LAB — CBG MONITORING, ED: Glucose-Capillary: 172 mg/dL — ABNORMAL HIGH (ref 70–99)

## 2022-10-02 NOTE — ED Triage Notes (Signed)
Pt arrived via EMS was at a restaurant, had syncopal event, states cbg read at 50 per self monitor. Given juice and sugar

## 2022-10-02 NOTE — Discharge Instructions (Signed)
It was our pleasure to provide your ER care today - we hope that you feel better.  Make sure to eat meals regularly and avoid skipping or delaying meals when taking diabetes and blood pressure medications.  Make sure to drink plenty of fluids/stay well hydrated.   If you begin to feel sugar is low, eat/drink something immediately and check sugar.   Follow up closely with primary care doctor in the next 1-2 weeks.  Return to ER if worse, new symptoms, fevers, new/severe pain, chest pain, trouble breathing, abdominal pain, weak/fainting, or other concern.

## 2022-10-02 NOTE — ED Provider Notes (Signed)
[place] [facility] Provider Note   CSN: [id number] Arrival date & time: 10/02/22  1451     History  Chief Complaint  Patient presents with   Hypoglycemia    Christian Jordan is a 72 y.o. male.  Patient with hx iddm, presents from restaurant where he had near syncope/syncopal event. Pt indicates he had not eaten today, but had taken his normal meds (indicates this is not that unusual for him on Sundays).  Was sitting at restaurant, began to feel faint, warmed, flushed, as if about to pass out. Pt remained sitting in chair, did not lose postural stone, but possible momentary loc. No sz activity. CBG 50. Pt given po fluids/sugar. Pt indicates earlier today felt fine, at baseline. No recent change in meds. No other recent syncope or low blood sugar. No fever or chills. No headache. No chest pain or sob. No abd pain or nvd. No dysuria or gu c/o.   The history is provided by the patient, the spouse, medical records and the EMS personnel.  Hypoglycemia Associated symptoms: no shortness of breath and no vomiting        Home Medications Prior to Admission medications   Medication Sig Start Date End Date Taking? Authorizing Provider  acarbose (PRECOSE) 100 MG tablet Take 100 mg by mouth 2 (two) times daily.    [provider]  aspirin EC 81 MG tablet Take 81 mg by mouth daily.    [provider]  atorvastatin (LIPITOR) 40 MG tablet Take 40 mg by mouth daily.    [provider]  gabapentin (NEURONTIN) 300 MG capsule Take 300 mg by mouth 2 (two) times daily.    [provider]  insulin aspart protamine- aspart (NOVOLOG MIX 70/30) (70-30) 100 UNIT/ML injection Inject 0.15 mLs (15 Units total) into the skin 2 (two) times daily with a meal. 05/21/18   Hongalgi, Lenis Dickinson, MD  lisinopril (PRINIVIL,ZESTRIL) 40 MG tablet Take 0.5 tablets (20 mg total) by mouth daily. 05/21/18   Hongalgi, Lenis Dickinson, MD  metFORMIN (GLUCOPHAGE) 1000 MG  tablet Take 1,000 mg by mouth 2 (two) times daily with a meal.    [provider]  naproxen (NAPROSYN) 500 MG tablet Take 500 mg by mouth daily with lunch.     [provider]      Allergies    Patient has no known allergies.    Review of Systems   Review of Systems  Constitutional:  Negative for fever.  HENT:  Negative for sore throat.   Eyes:  Negative for redness.  Respiratory:  Negative for cough and shortness of breath.   Cardiovascular:  Negative for chest pain, palpitations and leg swelling.  Gastrointestinal:  Negative for abdominal pain, blood in stool, diarrhea and vomiting.  Genitourinary:  Negative for dysuria and flank pain.  Musculoskeletal:  Negative for back pain and neck pain.  Skin:  Negative for rash.  Neurological:  Negative for headaches.  Hematological:  Does not bruise/bleed easily.  Psychiatric/Behavioral:  Negative for confusion.     Physical Exam Updated Vital Signs BP 122/69   Pulse 71   Temp 98.3 F (36.8 C) (Oral)   Resp 19   SpO2 95%  Physical Exam Vitals and nursing note reviewed.  Constitutional:      Appearance: Normal appearance. He is well-developed.  HENT:     Head: Atraumatic.     Nose: Nose normal.     Mouth/Throat:     Mouth: Mucous  membranes are moist.     Pharynx: Oropharynx is clear.  Eyes:     General: No scleral icterus.    Conjunctiva/sclera: Conjunctivae normal.     Pupils: Pupils are equal, round, and reactive to light.  Neck:     Vascular: No carotid bruit.     Trachea: No tracheal deviation.  Cardiovascular:     Rate and Rhythm: Normal rate and regular rhythm.     Pulses: Normal pulses.     Heart sounds: Normal heart sounds. No murmur heard.    No friction rub. No gallop.  Pulmonary:     Effort: Pulmonary effort is normal. No accessory muscle usage or respiratory distress.     Breath sounds: Normal breath sounds.  Abdominal:     General: Bowel sounds are normal. There is no distension.      Palpations: Abdomen is soft. There is no mass.     Tenderness: There is no abdominal tenderness.  Musculoskeletal:        General: No swelling or tenderness.     Cervical back: Normal range of motion and neck supple. No rigidity.  Skin:    General: Skin is warm and dry.     Findings: No rash.  Neurological:     Mental Status: He is alert.     Comments: Alert, speech clear. Motor/sens grossly intact bil.   Psychiatric:        Mood and Affect: Mood normal.     ED Results / Procedures / Treatments   Labs (all labs ordered are listed, but only abnormal results are displayed) Results for orders placed or performed during the hospital encounter of 10/02/22  CBC  Result Value Ref Range   WBC 9.0 4.0 - 10.5 K/uL   RBC 5.06 4.22 - 5.81 MIL/uL   Hemoglobin 15.4 13.0 - 17.0 g/dL   HCT 46.9 39.0 - 52.0 %   MCV 92.7 80.0 - 100.0 fL   MCH 30.4 26.0 - 34.0 pg   MCHC 32.8 30.0 - 36.0 g/dL   RDW 13.2 11.5 - 15.5 %   Platelets 218 150 - 400 K/uL   nRBC 0.0 0.0 - 0.2 %  Comprehensive metabolic panel  Result Value Ref Range   Sodium 142 135 - 145 mmol/L   Potassium 4.6 3.5 - 5.1 mmol/L   Chloride 107 98 - 111 mmol/L   CO2 24 22 - 32 mmol/L   Glucose, Bld 167 (H) 70 - 99 mg/dL   BUN 15 8 - 23 mg/dL   Creatinine, Ser 1.25 (H) 0.61 - 1.24 mg/dL   Calcium 9.1 8.9 - 10.3 mg/dL   Total Protein 7.4 6.5 - 8.1 g/dL   Albumin 4.0 3.5 - 5.0 g/dL   AST 18 15 - 41 U/L   ALT 14 0 - 44 U/L   Alkaline Phosphatase 47 38 - 126 U/L   Total Bilirubin 0.9 0.3 - 1.2 mg/dL   GFR, Estimated >60 >60 mL/min   Anion gap 11 5 - 15  CBG monitoring, ED  Result Value Ref Range   Glucose-Capillary 172 (H) 70 - 99 mg/dL    EKG EKG Interpretation  Date/Time:  Sunday October 02 2022 17:51:32 EST Ventricular Rate:  65 PR Interval:  169 QRS Duration: 75 QT Interval:  382 QTC Calculation: 398 R Axis:   -69 Text Interpretation: Sinus rhythm Nonspecific T wave abnormality Confirmed by Lajean Saver [phone]) on  10/02/2022 6:01:34 PM  Radiology No results found.  Procedures Procedures  Medications Ordered in ED Medications - No data to display  ED Course/ Medical Decision Making/ A&P                             Medical Decision Making Problems Addressed: Hypoglycemia: acute illness or injury with systemic symptoms that poses a threat to life or bodily functions Near syncope: acute illness or injury with systemic symptoms that poses a threat to life or bodily functions  Amount and/or Complexity of Data Reviewed Independent Historian: EMS    Details: hx External Data Reviewed: notes. Labs: ordered. Decision-making details documented in ED Course. ECG/medicine tests: ordered and independent interpretation performed. Decision-making details documented in ED Course.   Iv ns. Continuous pulse ox and cardiac monitoring. Labs ordered/sent.  Differential diagnosis includes hypoglycemia, hypotension, dehydration, anemia, etc . Dispo decision including potential need for admission considered - will get labs and reassess.   Reviewed nursing notes and prior charts for additional history. External reports reviewed. Additional history from: ems/family.   Cardiac monitor: sinus rhythm, rate 84.  Labs reviewed/interpreted by me - glucose 167, hgb and wbc normal.   Po fluids/food/meal tray.  No recurrence of earlier symptoms. Pt indicates feels fine. No faintness. No abd pain or nv. No chest pain. No sob. Vitals normal.  Pt currently appears stable for d/c. Rec close pcp f/u.  Return precautions provided.          Final Clinical Impression(s) / ED Diagnoses Final diagnoses:  Hypoglycemia  Near syncope    Rx / DC Orders ED Discharge Orders     None         Lajean Saver, MD 10/02/22 [phone]
